# Patient Record
Sex: Female | Born: 2000 | Race: Black or African American | Hispanic: No | Marital: Single | Smoking: Current every day smoker
Health system: Southern US, Community
[De-identification: ages and names within clinical notes are randomized; demographics above are authoritative.]

## PROBLEM LIST (undated history)

## (undated) DIAGNOSIS — Z862 Personal history of diseases of the blood and blood-forming organs and certain disorders involving the immune mechanism: Secondary | ICD-10-CM

## (undated) DIAGNOSIS — L309 Dermatitis, unspecified: Secondary | ICD-10-CM

## (undated) DIAGNOSIS — J302 Other seasonal allergic rhinitis: Secondary | ICD-10-CM

## (undated) DIAGNOSIS — F419 Anxiety disorder, unspecified: Secondary | ICD-10-CM

## (undated) DIAGNOSIS — R55 Syncope and collapse: Secondary | ICD-10-CM

## (undated) HISTORY — DX: Anxiety disorder, unspecified: F41.9

## (undated) HISTORY — DX: Syncope and collapse: R55

## (undated) HISTORY — PX: NO PAST SURGERIES: SHX2092

## (undated) HISTORY — DX: Personal history of diseases of the blood and blood-forming organs and certain disorders involving the immune mechanism: Z86.2

## (undated) HISTORY — DX: Dermatitis, unspecified: L30.9

---
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
## 2019-01-25 ENCOUNTER — Ambulatory Visit: Payer: Medicaid Other | Admitting: Family Medicine

## 2019-01-30 ENCOUNTER — Telehealth: Payer: Self-pay | Admitting: Family Medicine

## 2019-01-30 NOTE — Telephone Encounter (Signed)
Patient notified of her lab results & recommendations. Expressed understanding. Has a follow up appointment scheduled on 02/09/19.

## 2019-01-30 NOTE — Telephone Encounter (Signed)
Called patient to schedule appointment   Patient would like her lab results, patient goes into work at 11:30 and would like a call back before then.

## 2019-02-09 ENCOUNTER — Encounter: Payer: Self-pay | Admitting: Family Medicine

## 2019-02-09 ENCOUNTER — Ambulatory Visit (INDEPENDENT_AMBULATORY_CARE_PROVIDER_SITE_OTHER): Payer: Medicaid Other | Admitting: Family Medicine

## 2019-02-09 VITALS — BP 106/71 | HR 74 | Resp 17 | Ht 65.0 in | Wt 142.4 lb

## 2019-02-09 DIAGNOSIS — K2100 Gastro-esophageal reflux disease with esophagitis, without bleeding: Secondary | ICD-10-CM

## 2019-02-09 DIAGNOSIS — R630 Anorexia: Secondary | ICD-10-CM | POA: Diagnosis not present

## 2019-02-09 DIAGNOSIS — R1110 Vomiting, unspecified: Secondary | ICD-10-CM

## 2019-02-09 DIAGNOSIS — R110 Nausea: Secondary | ICD-10-CM | POA: Diagnosis not present

## 2019-02-09 DIAGNOSIS — R634 Abnormal weight loss: Secondary | ICD-10-CM

## 2019-02-09 MED ORDER — OMEPRAZOLE 20 MG PO CPDR: 20.0000 mg | DELAYED_RELEASE_CAPSULE | Freq: Every day | ORAL | 3 refills | Status: DC

## 2019-02-09 MED ORDER — ONDANSETRON HCL 4 MG PO TABS: 4.0000 mg | ORAL_TABLET | Freq: Three times a day (TID) | ORAL | 2 refills | Status: DC | PRN

## 2019-02-09 NOTE — Progress Notes (Signed)
Patient ID: Terri Potts, female    DOB: 10-13-2001, 18 y.o.   MRN: [id number]  PCP: Terri Neighbors, FNP  Chief Complaint  Patient presents with  . Gastroesophageal Reflux  . Weight Loss    still losing weight. trying to eat more but food either won't stay down or she has no appetite    Subjective:  HPI  Terri Potts is a 18 y.o. female presents for evaluation acid reflux symptoms and unintentional weight loss.  Last office visit note 12/28/2018: Patient presents today with a complaint of unintentional weight loss within the last 5-6 months. No prior known history of thyroid disease or impaired glycemic control. Feels weight loss initially started in June after her high school graduation.  However she feels weight loss and loss of appetite began around September 2019.  She reports most foods cause nausea and some produce vomitus.  She has been seen at the ER with a complaint of epigastric pain on 09/11/2018 underwent a CT of the abdomen was benign of any acute finding.  She also endorses some upper epigastric burning related to food intake.  She is uncertain of which foods exacerbate symptoms.  Present her current weight is 149.  Reports since around the fourth or fifth grade she has been approximately 200 pounds.  She has not made efforts to lose weight.  Denies any known history of diabetes or thyroid disease in the family.  She has not attempted relief with any over-the-counter PPI or antiacid therapy.  She has not seen a gastroenterologist previously or had a prior diagnosis of acid reflux.  Urine pregnancy is negative today.Patient's last menstrual period was 12/06/2018 (exact date). She has a history of marijuana usage although reports she stopped about 2 weeks ago. No prior HIV screen or RPR screening. She is sexually active and reports inconsistent use of barrier protection. She denies new headaches, chest pain, skin rashes,  new weakness ,  SOB, edema, night sweats, urine changes,  diarrhea , constipation, prior abdominal surgeries.  Today's visit  Patient presents today for follow-up of unintentional weight loss and acid reflux symptoms. Patient reports symptoms have not improved since last visit. Endorses frequent nausea with food intake, diminished appetite, and episodic vomiting. Endorses intermittent abdominal pain which she characterizes as burning in mid epigastric region. She is not requiring daily Zofran although she is taking omeprazole daily. She is tolerating fluids without issue. Weight has decreased by 7 lbs since last office visit. Body mass index is 23.7 kg/m.  Patient denies use of illicit drugs, reports no longer use of marijuana, or alcohol use. Social History   Socioeconomic History  . Marital status: Single    Spouse name: Not on file  . Number of children: Not on file  . Years of education: Not on file  . Highest education level: Not on file  Occupational History  . Not on file  Social Needs  . Financial resource strain: Not on file  . Food insecurity:    Worry: Not on file    Inability: Not on file  . Transportation needs:    Medical: Not on file    Non-medical: Not on file  Tobacco Use  . Smoking status: Current Every Day Smoker  . Smokeless tobacco: Never Used  Substance and Sexual Activity  . Alcohol use: No    Alcohol/week: 0.0 standard drinks  . Drug use: Not Currently  . Sexual activity: Never  Lifestyle  . Physical activity:    Days  per week: Not on file    Minutes per session: Not on file  . Stress: Not on file  Relationships  . Social connections:    Talks on phone: Not on file    Gets together: Not on file    Attends religious service: Not on file    Active member of club or organization: Not on file    Attends meetings of clubs or organizations: Not on file    Relationship status: Not on file  . Intimate partner violence:    Fear of current or ex partner: Not on file    Emotionally abused: Not on file     Physically abused: Not on file    Forced sexual activity: Not on file  Other Topics Concern  . Not on file  Social History Narrative  . Not on file    Family History  Problem Relation Age of Onset  . Drug abuse Maternal Grandmother   . Diabetes Maternal Grandmother   . Hypertension Maternal Grandmother   . Hypertension Paternal Grandmother   . Heart murmur Maternal Aunt   . Stroke Neg Hx   . Cancer Neg Hx    Review of Systems Pertinent negatives listed in HPI There are no active problems to display for this patient.   Allergies  Allergen Reactions  . Bee Venom Swelling    At site of bite    Prior to Admission medications   Medication Sig Start Date End Date Taking? Authorizing Provider  omeprazole (PRILOSEC) 20 MG capsule Take 1 capsule (20 mg total) by mouth daily. 12/28/18  Yes Terri NeighborsHarris, Lateya Dauria S, FNP  ondansetron (ZOFRAN) 4 MG tablet Take 1 tablet (4 mg total) by mouth every 8 (eight) hours as needed for nausea or vomiting. 12/28/18  Yes Terri NeighborsHarris, Adlyn Fife S, FNP    Past Medical, Surgical Family and Social History reviewed and updated.    Objective:   Today's Vitals   02/09/19 1556  BP: 106/71  Pulse: 74  Resp: 17  SpO2: 98%  Weight: 142 lb 6.4 oz (64.6 kg)  Height: 5\' 5"  (1.651 m)    Wt Readings from Last 3 Encounters:  02/09/19 142 lb 6.4 oz (64.6 kg) (75 %, Z= 0.68)*  12/28/18 149 lb (67.6 kg) (82 %, Z= 0.91)*  12/16/18 153 lb (69.4 kg) (85 %, Z= 1.03)*   * Growth percentiles are based on CDC (Girls, 2-20 Years) data.    Physical Exam General appearance: alert, well developed, cooperative and in no distress Head: Normocephalic, without obvious abnormality, atraumatic Respiratory: Respirations even and unlabored, normal respiratory rate Heart: rate and rhythm normal. No gallop or murmurs noted on exam  Abdomen: BS +, no distention, no rebound tenderness, or no mass Extremities: No gross deformities Skin: Skin color, texture, turgor normal. No rashes  seen  Psych: Appropriate mood and affect. Neurologic: Mental status: Alert, oriented to person, place, and time, thought content appropriate.   No results found for: POCGLU  Lab Results  Component Value Date   HGBA1C 5.2 12/28/2018      Assessment & Plan:  1. Chronic vomiting - Ambulatory referral to Gastroenterology -continue Zofran as needed. Educated to take at the first sign of nausea oppose to taking after vomiting episodes. Medication should be take to prevent vomitus   2. Loss of appetite GERD vs PUD  - Ambulatory referral to Gastroenterology  3. Chronic nausea -continue Zofran - Ambulatory referral to Gastroenterology  4. Gastroesophageal reflux disease with esophagitis -Continue Omeprazole until you follow-up  with Gastro.  - Ambulatory referral to Gastroenterology    Orders Placed This Encounter  Procedures  . Ambulatory referral to Gastroenterology    Referral Priority:   Routine    Referral Type:   Consultation    Referral Reason:   Specialty Services Required    Number of Visits Requested:   1    -The patient was given clear instructions to go to ER or return to medical center if symptoms do not improve, worsen or new problems develop. The patient verbalized understanding.    Joaquin Courts, FNP Primary Care at [facility] [address] [place] [address] [phone]: [phone]

## 2019-02-09 NOTE — Patient Instructions (Signed)
Peptic Ulcer  A peptic ulcer is a painful sore in the lining of your stomach or the first part of your small intestine. What are the causes? Common causes of this condition include:  An infection.  Using certain pain medicines too often or too much. What increases the risk? You are more likely to get this condition if you:  Smoke.  Have a family history of ulcer disease.  Drink alcohol.  Have been hospitalized in an intensive care unit (ICU). What are the signs or symptoms? Symptoms include:  Burning pain in the area between the chest and the belly button. The pain may: ? Not go away (be persistent). ? Be worse when your stomach is empty. ? Be worse at night.  Heartburn.  Feeling sick to your stomach (nauseous) and throwing up (vomiting).  Bloating. If the ulcer results in bleeding, it can cause you to:  Have poop (stool) that is black and looks like tar.  Throw up bright red blood.  Throw up material that looks like coffee grounds. How is this treated? Treatment for this condition may include:  Stopping things that can cause the ulcer, such as: ? Smoking. ? Using pain medicines.  Medicines to reduce stomach acid.  Antibiotic medicines if the ulcer is caused by an infection.  A procedure that is done using a small, flexible tube that has a camera at the end (upper endoscopy). This may be done if you have a bleeding ulcer.  Surgery. This may be needed if: ? You have a lot of bleeding. ? The ulcer caused a hole somewhere in the digestive system. Follow these instructions at home:  Do not drink alcohol if your doctor tells you not to drink.  Limit how much caffeine you take in.  Do not use any products that contain nicotine or tobacco, such as cigarettes, e-cigarettes, and chewing tobacco. If you need help quitting, ask your doctor.  Take over-the-counter and prescription medicines only as told by your doctor. ? Do not stop or change your medicines unless  you talk with your doctor about it first. ? Do not take aspirin, ibuprofen, or other NSAIDs unless your doctor told you to do so.  Keep all follow-up visits as told by your doctor. This is important. Contact a doctor if:  You do not get better in 7 days after you start treatment.  You keep having an upset stomach (indigestion) or heartburn. Get help right away if:  You have sudden, sharp pain in your belly (abdomen).  You have belly pain that does not go away.  You have bloody poop (stool) or black, tarry poop.  You throw up blood. It may look like coffee grounds.  You feel light-headed or feel like you may pass out (faint).  You get weak.  You get sweaty or feel sticky and cold to the touch (clammy). Summary  Symptoms of a peptic ulcer include burning pain in the area between the chest and the belly button.  Take medicines only as told by your doctor.  Limit how much alcohol and caffeine you have.  Keep all follow-up visits as told by your doctor. This information is not intended to replace advice given to you by your health care provider. Make sure you discuss any questions you have with your health care provider. Document Released: 02/24/2010 Document Revised: 06/07/2018 Document Reviewed: 06/07/2018 Elsevier Interactive Patient Education  2019 [employer]. [place] Diet A bland diet consists of foods that are often soft and do not  have a lot of fat, fiber, or extra seasonings. Foods without fat, fiber, or seasoning are easier for the body to digest. They are also less likely to irritate your mouth, throat, stomach, and other parts of your digestive system. A bland diet is sometimes called a BRAT diet. What is my plan? Your health care provider or food and nutrition specialist (dietitian) may recommend specific changes to your diet to prevent symptoms or to treat your symptoms. These changes may include:  Eating small meals often.  Cooking food until it is soft enough to  chew easily.  Chewing your food well.  Drinking fluids slowly.  Not eating foods that are very spicy, sour, or fatty.  Not eating citrus fruits, such as oranges and grapefruit. What do I need to know about this diet?  Eat a variety of foods from the bland diet food list.  Do not follow a bland diet longer than needed.  Ask your health care provider whether you should take vitamins or supplements. What foods can I eat? Grains  Hot cereals, such as cream of wheat. Rice. Bread, crackers, or tortillas made from refined white flour. Vegetables Canned or cooked vegetables. Mashed or boiled potatoes. Fruits  Bananas. Applesauce. Other types of cooked or canned fruit with the skin and seeds removed, such as canned peaches or pears. Meats and other proteins  Scrambled eggs. Creamy peanut butter or other nut butters. Lean, well-cooked meats, such as chicken or fish. Tofu. Soups or broths. Dairy Low-fat dairy products, such as milk, cottage cheese, or yogurt. Beverages  Water. Herbal tea. Apple juice. Fats and oils Mild salad dressings. Canola or olive oil. Sweets and desserts Pudding. Custard. Fruit gelatin. Ice cream. The items listed above may not be a complete list of recommended foods and beverages. Contact a dietitian for more options. What foods are not recommended? Grains Whole grain breads and cereals. Vegetables Raw vegetables. Fruits Raw fruits, especially citrus, berries, or dried fruits. Dairy Whole fat dairy foods. Beverages Caffeinated drinks. Alcohol. Seasonings and condiments Strongly flavored seasonings or condiments. Hot sauce. Salsa. Other foods Spicy foods. Fried foods. Sour foods, such as pickled or fermented foods. Foods with high sugar content. Foods high in fiber. The items listed above may not be a complete list of foods and beverages to avoid. Contact a dietitian for more information. Summary  A bland diet consists of foods that are often soft  and do not have a lot of fat, fiber, or extra seasonings.  Foods without fat, fiber, or seasoning are easier for the body to digest.  Check with your health care provider to see how long you should follow this diet plan. It is not meant to be followed for long periods. This information is not intended to replace advice given to you by your health care provider. Make sure you discuss any questions you have with your health care provider. Document Released: 03/23/2016 Document Revised: 12/29/2017 Document Reviewed: 12/29/2017 Elsevier Interactive Patient Education  2019 [employer].

## 2019-02-13 DIAGNOSIS — K2100 Gastro-esophageal reflux disease with esophagitis, without bleeding: Secondary | ICD-10-CM | POA: Insufficient documentation

## 2019-02-13 DIAGNOSIS — R110 Nausea: Secondary | ICD-10-CM | POA: Insufficient documentation

## 2019-02-13 DIAGNOSIS — R1110 Vomiting, unspecified: Secondary | ICD-10-CM | POA: Insufficient documentation

## 2019-02-13 DIAGNOSIS — R630 Anorexia: Secondary | ICD-10-CM | POA: Insufficient documentation

## 2019-02-13 DIAGNOSIS — R634 Abnormal weight loss: Secondary | ICD-10-CM | POA: Insufficient documentation

## 2019-02-22 ENCOUNTER — Encounter: Payer: Self-pay | Admitting: Gastroenterology

## 2019-02-28 ENCOUNTER — Encounter: Payer: Self-pay | Admitting: Gastroenterology

## 2019-02-28 ENCOUNTER — Other Ambulatory Visit: Payer: Self-pay

## 2019-02-28 ENCOUNTER — Ambulatory Visit (INDEPENDENT_AMBULATORY_CARE_PROVIDER_SITE_OTHER): Payer: Medicaid Other | Admitting: Gastroenterology

## 2019-02-28 VITALS — BP 90/50 | HR 58 | Temp 97.7°F | Ht 63.5 in | Wt 139.0 lb

## 2019-02-28 DIAGNOSIS — R112 Nausea with vomiting, unspecified: Secondary | ICD-10-CM

## 2019-02-28 DIAGNOSIS — K219 Gastro-esophageal reflux disease without esophagitis: Secondary | ICD-10-CM | POA: Diagnosis not present

## 2019-02-28 DIAGNOSIS — R6881 Early satiety: Secondary | ICD-10-CM

## 2019-02-28 DIAGNOSIS — R634 Abnormal weight loss: Secondary | ICD-10-CM | POA: Diagnosis not present

## 2019-02-28 MED ORDER — ONDANSETRON 4 MG PO TBDP: 4.0000 mg | ORAL_TABLET | Freq: Three times a day (TID) | ORAL | 1 refills | Status: DC | PRN

## 2019-02-28 MED ORDER — OMEPRAZOLE 40 MG PO CPDR: 40.0000 mg | DELAYED_RELEASE_CAPSULE | Freq: Every day | ORAL | 3 refills | Status: DC

## 2019-02-28 NOTE — Progress Notes (Signed)
HPI :  18 y/o female with no significant medical problems referred here by Joaquin Courts FNP for nausea and weight loss.   The patient states about 8-9 months ago she developed severe nausea. Over time it seems to come and go, can be precipitated by eating but also occur spontaneously. She had not had much vomiting until January where she had several days where she had multiple episodes of vomiting, a few times noticed a small amount of blood in her vomit. Since January she has not been vomiting too much. She endorses early satiety and poor appetite. She doesn't eat much. She thinks she is lost over 70 pounds since symptoms started at the end of her senior year last year. She does endorse some pyrosis and reflux symptoms at times. She does endorse some occasional odynophagia but denies any dysphagia. She denies any epigastric pain or postprandial pain. She is not taking any NSAIDs. Her bowels are mostly regular without any blood. She was given trial of low-dose omeprazole in January which she took for month, and then she stopped it. When she takes the Zofran provided to her it does help significantly. She doesn't take it too much.  She has a history of tobacco use. She smokes "black and milds" daily. She previously smoked marijuana routinely during the onset of symptoms through December, although she stop smoking marijuana completely at that time and has not had any improvement in her symptoms.   She's never had a prior upper endoscopy. She denies any family history of malignancy that she is aware of. Her aunt does have Crohn's disease. She's had normal TSH, renal function. CBC shows normal hemoglobin, last checked in October, along with normal liver function testing lipase. She had a CT scan performed in September which did not show any acute pathology or bowel wall thickening however the exam was limited in some areas by lack of enteric contrast.  Pelvic US 10/09/18 - normal exam CT scan 09/11/18 -  no acute pathology noted, somewhat limited exam by lack of enteric contrast   Past Medical History:  Diagnosis Date  . History of anemia   . Seasonal allergies      History reviewed. No pertinent surgical history. Family History  Problem Relation Age of Onset  . Drug abuse Maternal Grandmother   . Diabetes Maternal Grandmother   . Hypertension Maternal Grandmother   . Hypertension Paternal Grandmother   . Heart murmur Maternal Aunt   . Stroke Neg Hx   . Cancer Neg Hx    Social History   Tobacco Use  . Smoking status: Current Every Day Smoker  . Smokeless tobacco: Never Used  Substance Use Topics  . Alcohol use: No    Alcohol/week: 0.0 standard drinks  . Drug use: Not Currently   Current Outpatient Medications  Medication Sig Dispense Refill  . omeprazole (PRILOSEC) 20 MG capsule Take 1 capsule (20 mg total) by mouth daily. 30 capsule 3  . ondansetron (ZOFRAN) 4 MG tablet Take 1 tablet (4 mg total) by mouth every 8 (eight) hours as needed for nausea or vomiting. 30 tablet 2   No current facility-administered medications for this visit.    Allergies  Allergen Reactions  . Bee Venom Swelling    At site of bite     Review of Systems: All systems reviewed and negative except where noted in HPI.   Lab Results  Component Value Date   WBC 11.5 (H) 10/09/2018   HGB 12.1 10/09/2018  HCT 39.1 10/09/2018   MCV 87.5 10/09/2018   PLT 279 10/09/2018    Lab Results  Component Value Date   CREATININE 0.80 12/28/2018   BUN 7 12/28/2018   NA 146 (H) 12/28/2018   K 3.9 12/28/2018   CL 108 (H) 12/28/2018   CO2 24 12/28/2018    Lab Results  Component Value Date   ALT 19 10/09/2018   AST 25 10/09/2018   ALKPHOS 39 10/09/2018   BILITOT 0.9 10/09/2018     Physical Exam: BP (!) 90/50   Pulse (!) 58   Temp 97.7 F (36.5 C)   Ht 5\' 3"  (1.6 m)   Wt 139 lb (63 kg)   BMI 24.62 kg/m  Constitutional: Pleasant,well-developed, female in no acute distress. HEENT:  Normocephalic and atraumatic. Conjunctivae are normal. No scleral icterus. Neck supple.  Cardiovascular: Normal rate, regular rhythm.  Pulmonary/chest: Effort normal and breath sounds normal. No wheezing, rales or rhonchi. Abdominal: Soft, nondistended, nontender.  There are no masses palpable. No hepatomegaly. Extremities: no edema Lymphadenopathy: No cervical adenopathy noted. Neurological: Alert and oriented to person place and time. Skin: Skin is warm and dry. No rashes noted. Psychiatric: Normal mood and affect. Behavior is normal.   ASSESSMENT AND PLAN: 18 year old female here for new patient visit regarding the following:  Nausea / vomiting / early satiety / GERD / weight loss - persistent symptoms over several months. Her vomiting has resolved though she remains quite nauseated with poor appetite. She endorses significant weight loss (70 lbs) since symptoms started last year. She did have significant marijuana use at the onset of symptoms through several months however has completely abstained for marijuana over the past 3 months and states this has not made any significant difference.  CT scan and labs reassuring , however given her persistent symptoms at this point I'm recommending upper endoscopy to further evaluate. I discussed risks and benefits of upper endoscopy and she wanted to proceed. In the interim I'll add back a higher dose of omeprazole 40 mg once daily in light of her reflux symptoms and see if that helps. I also recommend she use Zofran routinely, she can take it a few times a day to keep the nausea at bay and help her appetite. Further recommendations to the results of this exam and her course.  Ileene Patrick, MD [facility]  CC: Bing Neighbors, FNP

## 2019-02-28 NOTE — Patient Instructions (Addendum)
If you are age 19 or older, your body mass index should be between 23-30. Your Body mass index is 24.24 kg/m. If this is out of the aforementioned range listed, please consider follow up with your Primary Care Provider.  If you are age 28 or younger, your body mass index should be between 19-25. Your Body mass index is 24.24 kg/m. If this is out of the aformentioned range listed, please consider follow up with your Primary Care Provider.   To help prevent the possible spread of infection to our patients, communities, and staff; we will be implementing the following measures:  Please only allow one visitor/family member to accompany you to any upcoming appointments with [facility]. If you have any concerns about this please contact our office to discuss prior to the appointment.   You have been scheduled for an endoscopy. Please follow written instructions given to you at your visit today. If you use inhalers (even only as needed), please bring them with you on the day of your procedure. Your physician has requested that you go to www.startemmi.com and enter the access code given to you at your visit today. This web site gives a general overview about your procedure. However, you should still follow specific instructions given to you by our office regarding your preparation for the procedure.  We have sent the following medications to your pharmacy for you to pick up at your convenience: Omeprazole 40mg : once a day Zofran 4mg  ODT: take every morning and every 8 hours as needed  Thank you for entrusting me with your care and for choosing [facility], Dr. Ileene Patrick

## 2019-03-07 ENCOUNTER — Telehealth: Payer: Self-pay

## 2019-03-07 NOTE — Telephone Encounter (Signed)
Covid-19 travel screening questions  Have you traveled in the last 14 days? No. If yes where?  Do you now or have you had a fever in the last 14 days? No.  Do you have any respiratory symptoms of shortness of breath or cough now or in the last 14 days? No.   Do you have a medical history of Congestive Heart Failure?  Do you have a medical history of lung disease?  Do you have any family members or close contacts with diagnosed or suspected Covid-19? No.   Informed patient about our new "No care partner in the lobby" policy. She understood and had no further questions.

## 2019-03-08 ENCOUNTER — Encounter: Payer: Self-pay | Admitting: Gastroenterology

## 2019-03-08 ENCOUNTER — Other Ambulatory Visit: Payer: Self-pay

## 2019-03-08 ENCOUNTER — Ambulatory Visit (AMBULATORY_SURGERY_CENTER): Payer: Medicaid Other | Admitting: Gastroenterology

## 2019-03-08 ENCOUNTER — Encounter: Payer: Medicaid Other | Admitting: Gastroenterology

## 2019-03-08 VITALS — BP 112/74 | HR 55 | Temp 97.7°F | Resp 12 | Ht 63.0 in | Wt 139.0 lb

## 2019-03-08 DIAGNOSIS — K449 Diaphragmatic hernia without obstruction or gangrene: Secondary | ICD-10-CM

## 2019-03-08 DIAGNOSIS — R112 Nausea with vomiting, unspecified: Secondary | ICD-10-CM

## 2019-03-08 DIAGNOSIS — K2950 Unspecified chronic gastritis without bleeding: Secondary | ICD-10-CM

## 2019-03-08 DIAGNOSIS — K2970 Gastritis, unspecified, without bleeding: Secondary | ICD-10-CM

## 2019-03-08 DIAGNOSIS — R634 Abnormal weight loss: Secondary | ICD-10-CM

## 2019-03-08 DIAGNOSIS — R6881 Early satiety: Secondary | ICD-10-CM

## 2019-03-08 MED ORDER — SODIUM CHLORIDE 0.9 % IV SOLN: 500.0000 mL | Freq: Once | INTRAVENOUS | Status: DC

## 2019-03-08 NOTE — Op Note (Signed)
[facility] Patient Name: Terri Potts Procedure Date: 03/08/2019 9:40 AM MRN: [id number] Endoscopist: Viviann Spare P. Adela Lank , MD Age: 18 Referring MD:  Date of Birth: [birth year] Gender: Female Account #: [id number] Procedure:                Upper GI endoscopy Indications:              Early satiety, Nausea with vomiting, Weight loss Medicines:                Monitored Anesthesia Care Procedure:                Pre-Anesthesia Assessment:                           - Prior to the procedure, a History and Physical                            was performed, and patient medications and                            allergies were reviewed. The patient's tolerance of                            previous anesthesia was also reviewed. The risks                            and benefits of the procedure and the sedation                            options and risks were discussed with the patient.                            All questions were answered, and informed consent                            was obtained. Prior Anticoagulants: The patient has                            taken no previous anticoagulant or antiplatelet                            agents. ASA Grade Assessment: II - A patient with                            mild systemic disease. After reviewing the risks                            and benefits, the patient was deemed in                            satisfactory condition to undergo the procedure.                           After obtaining informed consent, the endoscope was  passed under direct vision. Throughout the                            procedure, the patient's blood pressure, pulse, and                            oxygen saturations were monitored continuously. The                            Model GIF-[id number]) scope was introduced                            through the mouth, and advanced to the second part                            of  duodenum. The upper GI endoscopy was                            accomplished without difficulty. The patient                            tolerated the procedure well. Scope In: Scope Out: Findings:                 Esophagogastric landmarks were identified: the                            Z-line was found at 34 cm, the gastroesophageal                            junction was found at 34 cm and the upper extent of                            the gastric folds was found at 35 cm from the                            incisors.                           A 1 cm hiatal hernia was present.                           The exam of the esophagus was otherwise normal.                           The entire examined stomach was normal. Biopsies                            were taken with a cold forceps for Helicobacter                            pylori testing.                           The duodenal bulb and second portion  of the                            duodenum were normal. Complications:            No immediate complications. Estimated blood loss:                            Minimal. Estimated Blood Loss:     Estimated blood loss was minimal. Impression:               - Esophagogastric landmarks identified.                           - 1 cm hiatal hernia.                           - Normal esophagus otherwise.                           - Normal stomach. Biopsied to rule out H pylori.                           - Normal duodenal bulb and second portion of the                            duodenum. Recommendation:           - Patient has a contact number available for                            emergencies. The signs and symptoms of potential                            delayed complications were discussed with the                            patient. Return to normal activities tomorrow.                            Written discharge instructions were provided to the                            patient.                            - Resume previous diet.                           - Continue present medications. (trial of Zofran                            and higher dose PPI)                           - Await pathology results.                           -  Further recommendations pending course and biopsy                            results Willaim Rayas. Darinda [place], MD 03/08/2019 10:17:06 AM This report has been signed electronically.

## 2019-03-08 NOTE — Patient Instructions (Signed)
Continue trial of Zofran and higher dose PPI. Handout given on hiatal hernia. Wait to find out if you test positive for hpylori.    YOU HAD AN ENDOSCOPIC PROCEDURE TODAY AT THE  ENDOSCOPY CENTER:   Refer to the procedure report that was given to you for any specific questions about what was found during the examination.  If the procedure report does not answer your questions, please call your gastroenterologist to clarify.  If you requested that your care partner not be given the details of your procedure findings, then the procedure report has been included in a sealed envelope for you to review at your convenience later.  YOU SHOULD EXPECT: Some feelings of bloating in the abdomen. Passage of more gas than usual.  Walking can help get rid of the air that was put into your GI tract during the procedure and reduce the bloating. If you had a lower endoscopy (such as a colonoscopy or flexible sigmoidoscopy) you may notice spotting of blood in your stool or on the toilet paper. If you underwent a bowel prep for your procedure, you may not have a normal bowel movement for a few days.  Please Note:  You might notice some irritation and congestion in your nose or some drainage.  This is from the oxygen used during your procedure.  There is no need for concern and it should clear up in a day or so.  SYMPTOMS TO REPORT IMMEDIATELY:    Following upper endoscopy (EGD)  Vomiting of blood or coffee ground material  New chest pain or pain under the shoulder blades  Painful or persistently difficult swallowing  New shortness of breath  Fever of 100F or higher  Black, tarry-looking stools  For urgent or emergent issues, a gastroenterologist can be reached at any hour by calling [phone].   DIET:  We do recommend a small meal at first, but then you may proceed to your regular diet.  Drink plenty of fluids but you should avoid alcoholic beverages for 24 hours.  ACTIVITY:  You should plan to  take it easy for the rest of today and you should NOT DRIVE or use heavy machinery until tomorrow (because of the sedation medicines used during the test).    FOLLOW UP: Our staff will call the number listed on your records the next business day following your procedure to check on you and address any questions or concerns that you may have regarding the information given to you following your procedure. If we do not reach you, we will leave a message.  However, if you are feeling well and you are not experiencing any problems, there is no need to return our call.  We will assume that you have returned to your regular daily activities without incident.  If any biopsies were taken you will be contacted by phone or by letter within the next 1-3 weeks.  Please call us at [phone] if you have not heard about the biopsies in 3 weeks.    SIGNATURES/CONFIDENTIALITY: You and/or your care partner have signed paperwork which will be entered into your electronic medical record.  These signatures attest to the fact that that the information above on your After Visit Summary has been reviewed and is understood.  Full responsibility of the confidentiality of this discharge information lies with you and/or your care-partner.

## 2019-03-08 NOTE — Progress Notes (Signed)
Report to PACU, RN, vss, BBS= Clear.  

## 2019-03-08 NOTE — Progress Notes (Signed)
Pt's states no medical or surgical changes since previsit or office visit. 

## 2019-03-08 NOTE — Progress Notes (Signed)
Called to room to assist during endoscopic procedure.  Patient ID and intended procedure confirmed with present staff. Received instructions for my participation in the procedure from the performing physician.  

## 2019-03-09 ENCOUNTER — Telehealth: Payer: Self-pay

## 2019-03-09 NOTE — Telephone Encounter (Signed)
  Follow up Call-  Call back number 03/08/2019  Post procedure Call Back phone  # [phone]  Permission to leave phone message Yes  Some recent data might be hidden     Patient questions:  Do you have a fever, pain , or abdominal swelling? No. Pain Score  0 *  Have you tolerated food without any problems? Yes.    Have you been able to return to your normal activities? Yes.    Do you have any questions about your discharge instructions: Diet   No. Medications  No. Follow up visit  No.  Do you have questions or concerns about your Care? No.  Actions: * If pain score is 4 or above: No action needed, pain <4.

## 2019-04-17 ENCOUNTER — Other Ambulatory Visit: Payer: Self-pay

## 2019-04-17 ENCOUNTER — Ambulatory Visit (INDEPENDENT_AMBULATORY_CARE_PROVIDER_SITE_OTHER): Payer: Medicaid Other | Admitting: Family Medicine

## 2019-04-17 ENCOUNTER — Encounter: Payer: Self-pay | Admitting: Family Medicine

## 2019-04-17 DIAGNOSIS — N760 Acute vaginitis: Secondary | ICD-10-CM | POA: Diagnosis not present

## 2019-04-17 MED ORDER — METRONIDAZOLE 500 MG PO TABS: 500.0000 mg | ORAL_TABLET | Freq: Two times a day (BID) | ORAL | 0 refills | Status: DC

## 2019-04-17 NOTE — Progress Notes (Signed)
Virtual Visit via Telephone Note  I connected with Terri Potts on 04/17/19 at  1:50 PM EDT by telephone and verified that I am speaking with the correct person using two identifiers.  Location: Patient: Conducting this telephonic encounter from her place of employment  Provider: Provider is located at primary care office.   I discussed the limitations, risks, security and privacy concerns of performing an evaluation and management service by telephone and the availability of in person appointments. I also discussed with the patient that there may be a patient responsible charge related to this service. The patient expressed understanding and agreed to proceed.   History of Present Illness: Vaginitis: Patient complains of an abnormal vaginal discharge for 2 weeks. Vaginal symptoms include odor.Vulvar symptoms include none.STI Risk: Very low risk of STD exposureDischarge described as: white.Other associated symptoms: none.Menstrual pattern: She had been bleeding regularly. Contraception: none. Patient's last menstrual period was 04/14/2019.Odor is the same as prior BV infections. Last BV infection 12/16/18. Symptoms resolved with metronidazole.   Assessment and Plan: 1. Acute vaginitis Treating for bacterial vaginosis. Patient has a history of recurrent infections. Discussed considering OTC probiotics, yogurt daily as measures to restore norma flora balance. Also discussed possible suppression therapy. Patient will consider and follow-up if symptoms worsen or do not improve.  Follow Up Instructions: Follow-up as needed    I discussed the assessment and treatment plan with the patient. The patient was provided an opportunity to ask questions and all were answered. The patient agreed with the plan and demonstrated an understanding of the instructions.   The patient was advised to call back or seek an in-person evaluation if the symptoms worsen or if the condition fails to improve as  anticipated.  I provided 15 minutes of non-face-to-face time during this encounter.   Joaquin Courts, FNP

## 2019-04-17 NOTE — Progress Notes (Deleted)
Called patient to initiate their telephone visit with provider Joaquin Courts, FNP-C. Verified date of birth. Patient has c/o "fishy" odor x 2 weeks. Denies pain, itching, abnormal discharge, urinary symptoms. KWalker, CMA.

## 2019-04-18 ENCOUNTER — Encounter: Payer: Self-pay | Admitting: Family Medicine

## 2019-05-10 ENCOUNTER — Ambulatory Visit: Payer: Medicaid Other | Admitting: Family Medicine

## 2019-05-12 IMAGING — US US TRANSVAGINAL NON-OB
1 series · 13 of 25 positions shown · non-contrast
Comparison: CT of the abdomen and pelvis on 09/11/2018

CLINICAL DATA: Pelvic pain and back pain since this morning.

EXAM:
TRANSABDOMINAL AND TRANSVAGINAL ULTRASOUND OF PELVIS
TECHNIQUE: Both transabdominal and transvaginal ultrasound examinations of the
pelvis were performed. Transabdominal technique was performed for
global imaging of the pelvis including uterus, ovaries, adnexal
regions, and pelvic cul-de-sac. It was necessary to proceed with
endovaginal exam following the transabdominal exam to visualize the
uterus, endometrium, ovaries, and adnexal regions.

[Series 1: us transvaginal non-ob · 13 of 108 slices shown]
[im 1/108]
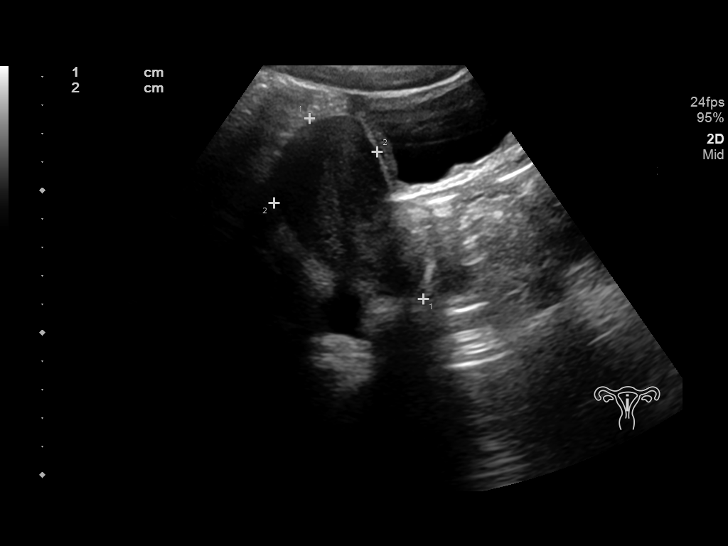
[im 9/108]
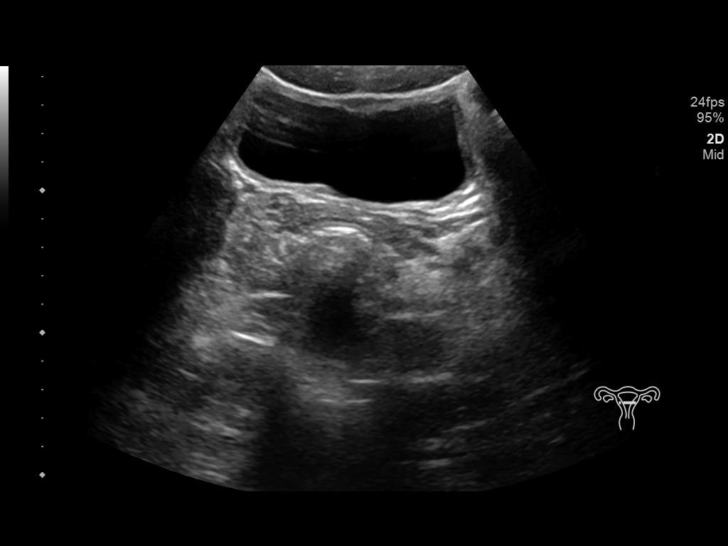
[im 18/108]
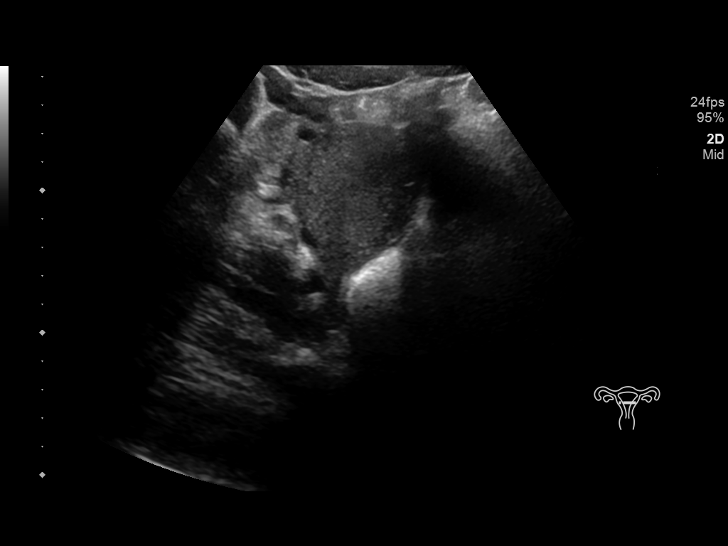
[im 27/108]
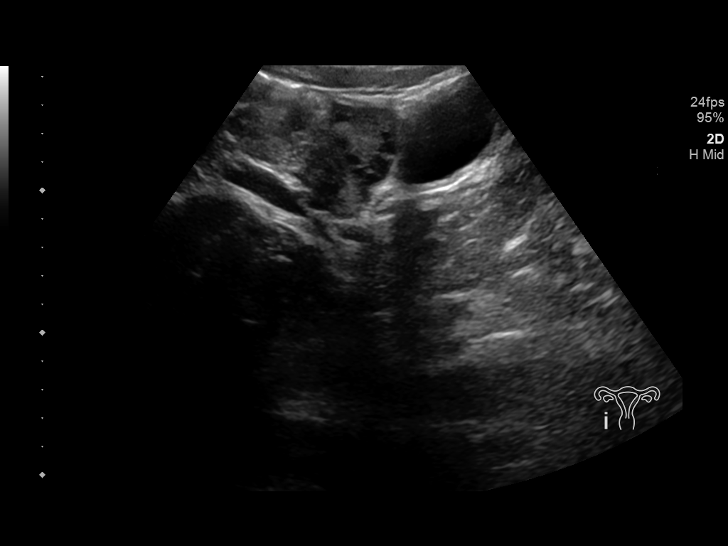
[im 36/108]
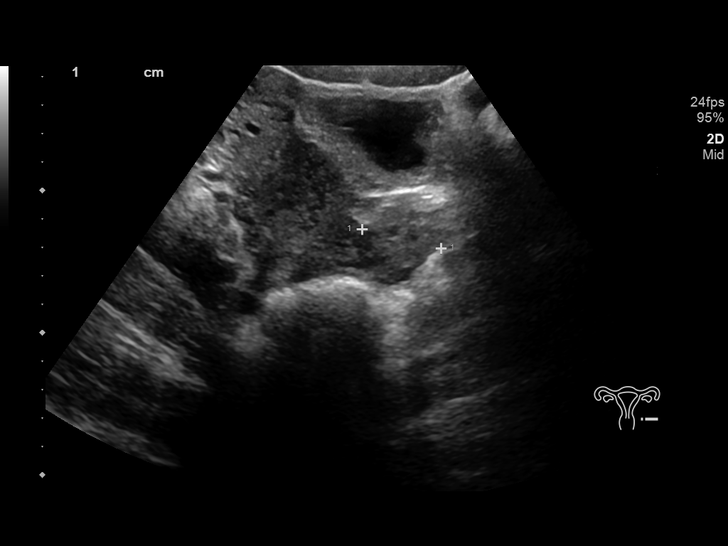
[im 45/108]
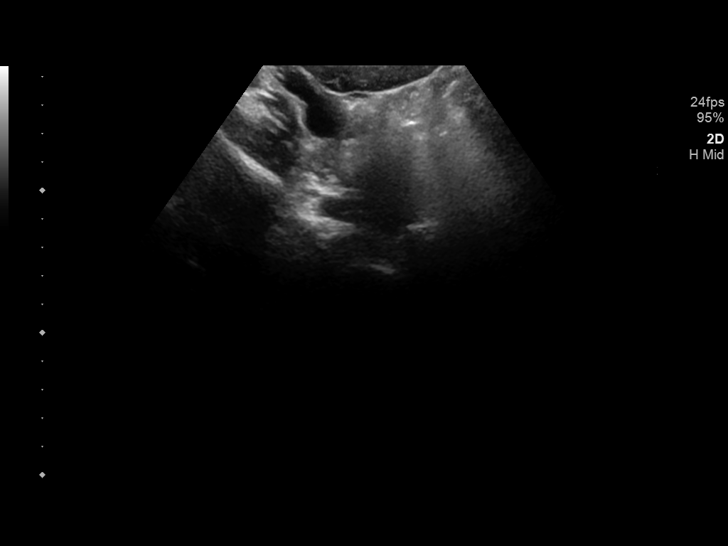
[im 54/108]
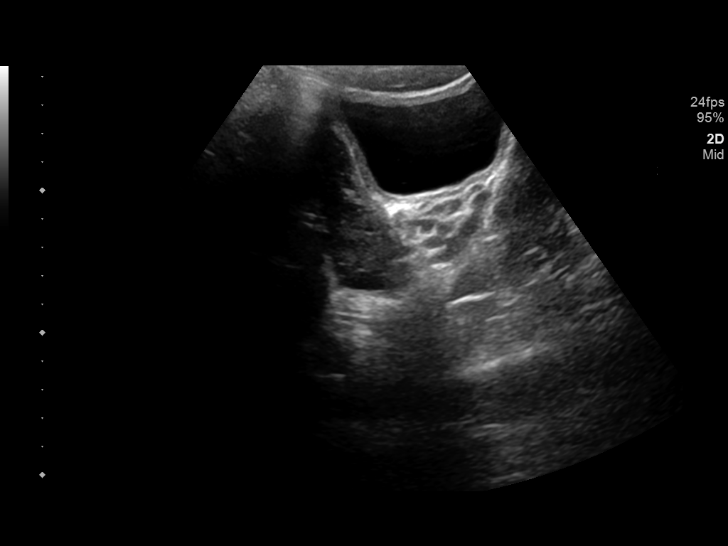
[im 63/108]
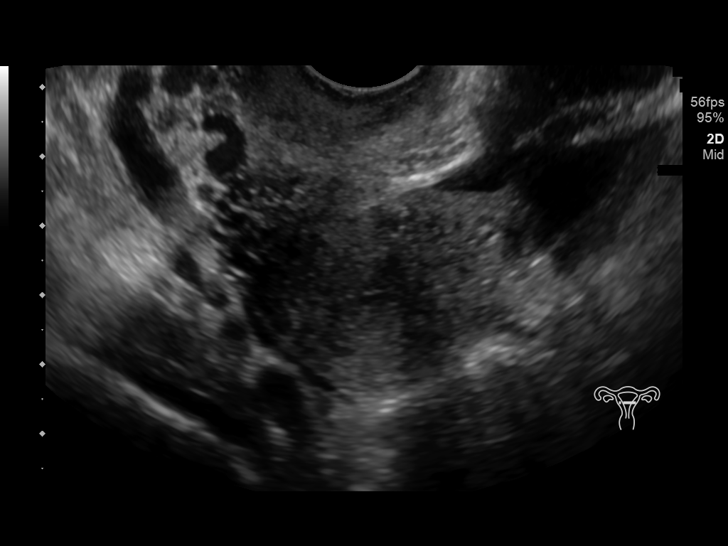
[im 72/108]
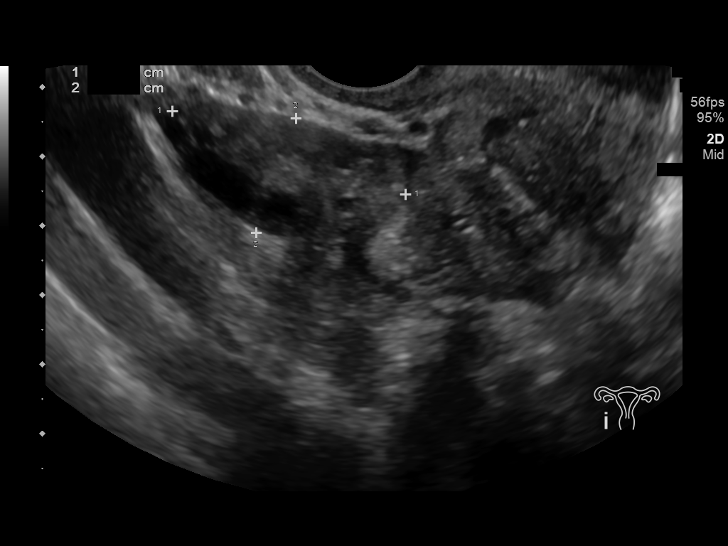
[im 81/108]
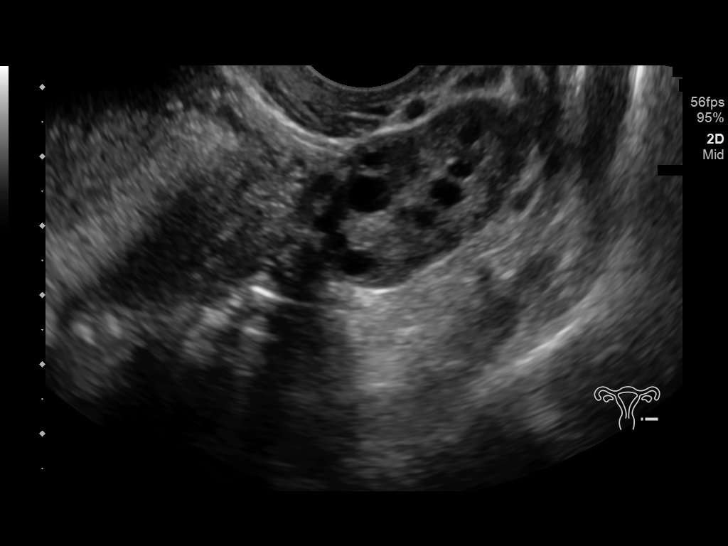
[im 90/108]
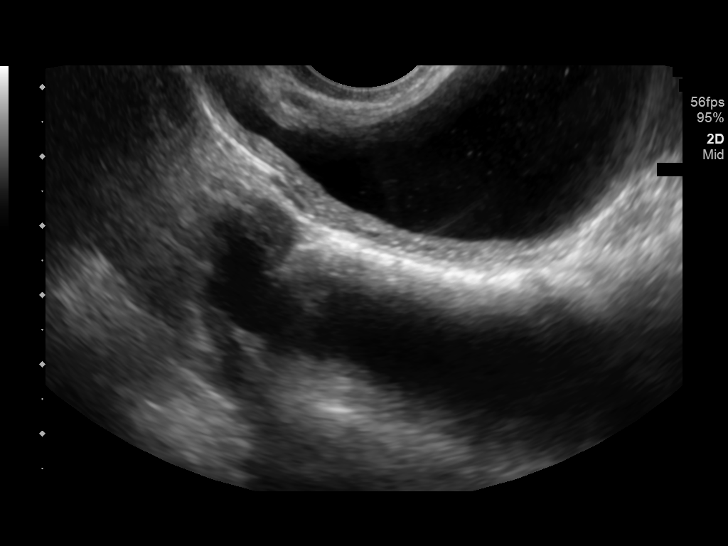
[im 99/108]
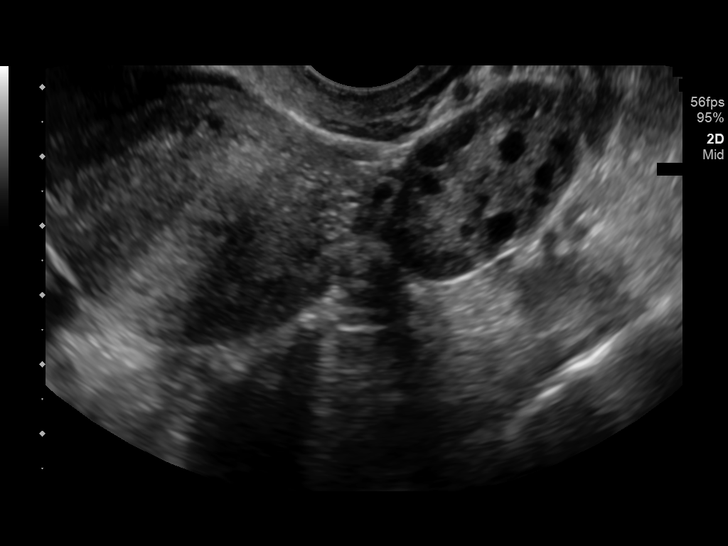
[im 108/108]
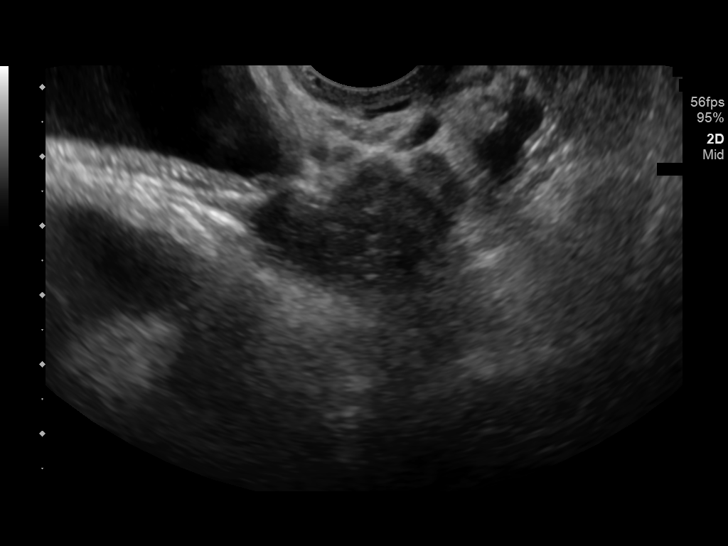

[13 of 25 positions shown; findings below may reference images not displayed]

FINDINGS: Uterus

Measurements: 7.5 x 4.0 x 4.8 centimeters. No fibroids or other mass
visualized.

Endometrium

Thickness: 6.7 millimeters.  No focal abnormality visualized.

Right ovary

Measurements: 3.6 x 1.8 x 2.8 centimeters. Normal appearance/no
adnexal mass. Color Doppler flow is demonstrated.

Left ovary

Measurements: 4.0 x 1.9 x 3.4 centimeters. Normal appearance/no
adnexal mass. Color Doppler flow is identified.

Pulsed Doppler evaluation of both ovaries demonstrates normal
low-resistance arterial and venous waveforms.

Other findings

Trace free pelvic fluid.
IMPRESSION: Normal pelvic ultrasound.

## 2019-06-05 ENCOUNTER — Ambulatory Visit (INDEPENDENT_AMBULATORY_CARE_PROVIDER_SITE_OTHER): Payer: Medicaid Other | Admitting: Family Medicine

## 2019-06-05 ENCOUNTER — Other Ambulatory Visit: Payer: Self-pay

## 2019-06-05 DIAGNOSIS — R110 Nausea: Secondary | ICD-10-CM | POA: Diagnosis not present

## 2019-06-05 DIAGNOSIS — Z3009 Encounter for other general counseling and advice on contraception: Secondary | ICD-10-CM | POA: Diagnosis not present

## 2019-06-05 DIAGNOSIS — R6889 Other general symptoms and signs: Secondary | ICD-10-CM | POA: Diagnosis not present

## 2019-06-05 MED ORDER — ONDANSETRON 4 MG PO TBDP: 4.0000 mg | ORAL_TABLET | Freq: Three times a day (TID) | ORAL | 1 refills | Status: DC | PRN

## 2019-06-05 NOTE — Progress Notes (Signed)
Virtual Visit via Telephone Note  I connected with Terri Potts on 06/05/19 at  1:50 PM EDT by telephone and verified that I am speaking with the correct person using two identifiers.  Location: Patient: Located at home during today's encounter  Provider: Located at primary care office   I discussed the limitations, risks, security and privacy concerns of performing an evaluation and management service by telephone and the availability of in person appointments. I also discussed with the patient that there may be a patient responsible charge related to this service. The patient expressed understanding and agreed to proceed.   History of Present Illness: Weight functionating  Terri Potts reports continued concern regarding weight. She reports that her weight is fluctuating up and down approximately 5lbs.     Contraception  Would like to start birth control. Uncertain of which form. Endorses irregular menstrual cycle as she occasionally has two cycles less than 28 days apart. Reports a 2 year history of anemia related to a heavy cycle and was placed on temporary oral contraception to regulate cycle. She is sexually active.   Assessment and Plan: 1. Chronic nausea Recent negative EGD.  Nausea symptoms have actually improved since GI follow-up.  GERD symptoms have all but resolved as she is currently not taking the omeprazole.  Overall GI work-up was reassuring.  Patient has had no gross abnormal weight loss or early satiety which also is reassuring for any acute underlying GI condition. Will continue Zofran only as needed.  Patient encouraged to avoid irritating foods.  Patient encouraged not to skip meals  She is also encouraged to resume omeprazole if acid reflux symptoms recur.  2. Family planning education, guidance, and counseling - POCT urine pregnancy; Future  -Discussed all available options of contraception.  Patient advised that she would like to initiate contraceptive treatment  with oral contraception.  Patient will return to office tomorrow for lab appointment for an hCG urine test.  Patient advised once a negative pregnancy test has been obtained will prescribe Ortho Tri-Cyclen cycling.  She is advised if symptoms of nausea worsen or headaches occur to follow-up to discuss other oral birth control options.  3.  Fluctuation of weight See #1  Follow Up Instructions: Lab appointment tomorrow 06/06/19 Urine HCG    I discussed the assessment and treatment plan with the patient. The patient was provided an opportunity to ask questions and all were answered. The patient agreed with the plan and demonstrated an understanding of the instructions.   The patient was advised to call back or seek an in-person evaluation if the symptoms worsen or if the condition fails to improve as anticipated.  I provided 20 minutes of non-face-to-face time during this encounter.   Molli Barrows, FNP

## 2019-06-05 NOTE — Progress Notes (Deleted)
Called patient to initiate their telephone visit with provider Molli Barrows, FNP-C. Verified date of birth. Patient states that she has stopped taking the Omeprazole. GERD symptoms have been "ok". Would like refills on Zofran. Patient also states that she is still losing weight. Hasn't been able to weigh herself but she states that her clothes are fitting looser. KWalker, CMA.

## 2019-06-06 ENCOUNTER — Other Ambulatory Visit: Payer: Medicaid Other

## 2019-06-07 ENCOUNTER — Other Ambulatory Visit (INDEPENDENT_AMBULATORY_CARE_PROVIDER_SITE_OTHER): Payer: Medicaid Other

## 2019-06-07 ENCOUNTER — Other Ambulatory Visit: Payer: Self-pay

## 2019-06-07 DIAGNOSIS — Z3009 Encounter for other general counseling and advice on contraception: Secondary | ICD-10-CM

## 2019-06-07 DIAGNOSIS — Z3202 Encounter for pregnancy test, result negative: Secondary | ICD-10-CM

## 2019-06-07 LAB — POCT URINE PREGNANCY: Preg Test, Ur: NEGATIVE

## 2019-06-07 MED ORDER — NORGESTIM-ETH ESTRAD TRIPHASIC 0.18/0.215/0.25 MG-35 MCG PO TABS: 1.0000 | ORAL_TABLET | Freq: Every day | ORAL | 11 refills | Status: DC

## 2019-06-07 NOTE — Progress Notes (Signed)
Patient here for POCT urine pregnancy test for OCP initiation. Test was negative. Informed provider who states that she will go ahead & send over OCP to pharmacy on file. KWalker, CMA.

## 2019-06-07 NOTE — Patient Instructions (Signed)
Negative pregnancy test. Will initiate OCP. Electronically prescribed Ortho-cyclen

## 2019-06-29 ENCOUNTER — Ambulatory Visit: Payer: Medicaid Other | Admitting: Family Medicine

## 2019-07-12 ENCOUNTER — Telehealth: Payer: Self-pay | Admitting: Family Medicine

## 2019-07-12 NOTE — Telephone Encounter (Signed)
Patient called and sated she had been bleeding for 3 weeks and wants to know what to do about the [facility]

## 2019-07-12 NOTE — Telephone Encounter (Signed)
Patient can go to urgent care for evaluation or schedule a visit for next week.

## 2019-07-17 ENCOUNTER — Telehealth: Payer: Medicaid Other | Admitting: Family Medicine

## 2019-08-02 ENCOUNTER — Telehealth: Payer: Self-pay | Admitting: Family Medicine

## 2019-08-02 NOTE — Telephone Encounter (Signed)
Patients mother called and was very rude asked to speak to the supervisor and sated we where playing with her daughters health. We have made appointment for my chart visits and she has canceled. She wanted to switch birthcontrol when Terri Potts was here and kim told here it hadn't been a month and to give it time. Patients mother would like to speak to management

## 2019-08-08 ENCOUNTER — Telehealth: Payer: Self-pay

## 2019-08-08 NOTE — Telephone Encounter (Signed)

## 2019-08-09 ENCOUNTER — Encounter: Payer: Self-pay | Admitting: Nurse Practitioner

## 2019-08-09 ENCOUNTER — Other Ambulatory Visit: Payer: Self-pay

## 2019-08-09 ENCOUNTER — Ambulatory Visit (INDEPENDENT_AMBULATORY_CARE_PROVIDER_SITE_OTHER): Payer: Medicaid Other | Admitting: Nurse Practitioner

## 2019-08-09 VITALS — BP 118/78 | HR 79 | Temp 97.3°F | Resp 16 | Ht 63.5 in | Wt 140.2 lb

## 2019-08-09 DIAGNOSIS — N898 Other specified noninflammatory disorders of vagina: Secondary | ICD-10-CM

## 2019-08-09 DIAGNOSIS — B372 Candidiasis of skin and nail: Secondary | ICD-10-CM | POA: Diagnosis not present

## 2019-08-09 DIAGNOSIS — Z3202 Encounter for pregnancy test, result negative: Secondary | ICD-10-CM | POA: Diagnosis not present

## 2019-08-09 DIAGNOSIS — R634 Abnormal weight loss: Secondary | ICD-10-CM

## 2019-08-09 DIAGNOSIS — Z3009 Encounter for other general counseling and advice on contraception: Secondary | ICD-10-CM

## 2019-08-09 LAB — POCT URINE PREGNANCY: Preg Test, Ur: NEGATIVE

## 2019-08-09 MED ORDER — NYSTATIN 100000 UNIT/GM EX CREA: 1.0000 "application " | TOPICAL_CREAM | Freq: Two times a day (BID) | CUTANEOUS | 0 refills | Status: DC

## 2019-08-09 MED ORDER — FLUCONAZOLE 150 MG PO TABS: 150.0000 mg | ORAL_TABLET | ORAL | 0 refills | Status: AC

## 2019-08-09 NOTE — Progress Notes (Signed)
Patient has had prolonged bleeding while on OCP. Would like to discuss switching to Depo-Provera.  States that she has noticed worsened cramps & clotting on her cycles now.

## 2019-08-09 NOTE — Progress Notes (Signed)
Assessment & Plan:  Terri Potts was seen today for contraception.  Diagnoses and all orders for this visit:  Encounter for other general counseling or advice on contraception -     POCT urine pregnancy  Unintended weight loss -     Referral to Nutrition and Diabetes Services  Candidiasis, intertrigo -     fluconazole (DIFLUCAN) 150 MG tablet; Take 1 tablet (150 mg total) by mouth every 3 (three) days for 3 doses. -     nystatin cream (MYCOSTATIN); Apply 1 application topically 2 (two) times daily.    Patient has been counseled on age-appropriate routine health concerns for screening and prevention. These are reviewed and up-to-date. Referrals have been placed accordingly. Immunizations are up-to-date or declined.    Subjective:   Chief Complaint  Patient presents with  . Contraception   HPI Terri Potts 18 y.o. female presents to office today initially [facility]. However after inquiring into the reason she is desiring to switch birth control she states "so I can  gain weight". I asked her how she was doing on her current birth control and she states she had a normal cycle this month compared to last month in which she experienced significant menorrhagia and metrorrhagia. I explained to her that everyone does not have the same side effects with their birth control and there is no guarantee that she will gain weight with depo and she may also begin to have irregular cycles with initiation of a new contraceptive. She now declines depo and will continue on OCP. She is a smoker and has been made of the risk of clot formation with OCP and smoking.   Rash: Patient complains of rash involving the genitalia and and inbetween her buttocks near rectum. Rash started a few weeks ago. Discomfort associated with rash: is pruritic. Patient has not had previous evaluation of rash. Patient has not had previous treatment.   Patient has not had contacts with  similar rash. Patient has not identified precipitant. Patient has not had new exposures (soaps, lotions, laundry detergents, foods, medications, plants, insects or animals.) She had a prolonged menstrual cycle last month and states she does not use tampons and wears pads only. Likely the cause of her rash.   Dep[facility] screen [facility] 12/28/2018 12/28/2018  Decreased Interest 2 2  Down, Depressed, Hopeless 2 2  PHQ - 2 Score 4 4  Altered sleeping 1 1  Tired, decreased energy 2 2  Change in appetite 2 2  Feeling bad or failure about yourself  1 1  Trouble concentrating 0 0  Moving slowly or fidgety/restless 0 0  Suicidal thoughts 0 0  PHQ-9 Score 10 10   Review of Systems  Constitutional: Negative for fever, malaise/fatigue and weight loss.  HENT: Negative.  Negative for nosebleeds.   Eyes: Negative.  Negative for blurred vision, double vision and photophobia.  Respiratory: Negative.  Negative for cough and shortness of breath.   Cardiovascular: Negative.  Negative for chest pain, palpitations and leg swelling.  Gastrointestinal: Negative.  Negative for heartburn, nausea and vomiting.  Musculoskeletal: Negative.  Negative for myalgias.  Skin: Positive for itching and rash.  Neurological: Negative.  Negative for dizziness, focal weakness, seizures and headaches.  Endo/Heme/Allergies: Positive for environmental allergies.  Psychiatric/Behavioral: Negative.  Negative for suicidal ideas.    Past Medical History:  Diagnosis Date  . History of anemia   . Seasonal allergies     No past  surgical history on file.  Family History  Problem Relation Age of Onset  . Drug abuse Maternal Grandmother   . Diabetes Maternal Grandmother   . Hypertension Maternal Grandmother   . Hypertension Paternal Grandmother   . Heart murmur Maternal Aunt   . Stroke Neg Hx   . Cancer Neg Hx     Social History Reviewed with no changes to be made today.   Outpatient Medications Prior to Visit  Medication  Sig Dispense Refill  . Norgestimate-Ethinyl Estradiol Triphasic (ORTHO TRI-CYCLEN, 28,) 0.18/0.215/0.25 MG-35 MCG tablet Take 1 tablet by mouth daily. 1 Package 11  . omeprazole (PRILOSEC) 40 MG capsule Take 1 capsule by mouth daily.    . ondansetron (ZOFRAN ODT) 4 MG disintegrating tablet Take 1 tablet (4 mg total) by mouth every 8 (eight) hours as needed for nausea or vomiting. 90 tablet 1   No facility-administered medications prior to visit.     Allergies  Allergen Reactions  . Bee Venom Swelling    At site of bite       Objective:    BP 118/78   Pulse 79   Temp (!) 97.3 F (36.3 C) (Temporal)   Resp 16   Ht 5' 3.5" (1.613 m)   Wt 140 lb 3.2 oz (63.6 kg)   LMP 08/06/2019   SpO2 99%   BMI 24.45 kg/m  Wt Readings from Last 3 Encounters:  08/09/19 140 lb 3.2 oz (63.6 kg) (71 %, Z= 0.55)*  03/08/19 139 lb (63 kg) (71 %, Z= 0.55)*  02/28/19 139 lb (63 kg) (71 %, Z= 0.55)*   * Growth percentiles are based on CDC (Girls, 2-20 Years) data.    Physical Exam Vitals signs and nursing note reviewed.  Constitutional:      Appearance: She is well-developed.  HENT:     Head: Normocephalic and atraumatic.  Neck:     Musculoskeletal: Normal range of motion.  Cardiovascular:     Rate and Rhythm: Normal rate and regular rhythm.     Heart sounds: Normal heart sounds. No murmur. No friction rub. No gallop.   Pulmonary:     Effort: Pulmonary effort is normal. No tachypnea or respiratory distress.     Breath sounds: Normal breath sounds. No decreased breath sounds, wheezing, rhonchi or rales.  Chest:     Chest wall: No tenderness.  Abdominal:     General: Bowel sounds are normal.     Palpations: Abdomen is soft.  Genitourinary:    Exam position: Knee-chest position.     Comments: Candidal rash leading from introitus to rectal area and gluteal fold Musculoskeletal: Normal range of motion.  Skin:    General: Skin is warm and dry.  Neurological:     Mental Status: She is alert  and oriented to person, place, and time.     Coordination: Coordination normal.  Psychiatric:        Behavior: Behavior normal. Behavior is cooperative.        Thought Content: Thought content normal.        Judgment: Judgment normal.          Patient has been counseled extensively about nutrition and exercise as well as the importance of adherence with medications and regular follow-up. The patient was given clear instructions to go to ER or return to medical center if symptoms don't improve, worsen or new problems develop. The patient verbalized understanding.   Follow-up: Return if symptoms worsen or fail to improve.   Vernia Buff  Meredeth Ide, FNP-BC [facility] [place], [place] [phone]

## 2019-08-10 ENCOUNTER — Encounter: Payer: Self-pay | Admitting: Nurse Practitioner

## 2019-09-01 ENCOUNTER — Encounter (HOSPITAL_COMMUNITY): Payer: Self-pay | Admitting: Emergency Medicine

## 2019-09-01 ENCOUNTER — Ambulatory Visit (HOSPITAL_COMMUNITY)
Admission: EM | Admit: 2019-09-01 | Discharge: 2019-09-01 | Disposition: A | Discharge status: Home / Self Care | Payer: Medicaid Other | Attending: Family Medicine | Admitting: Family Medicine

## 2019-09-01 ENCOUNTER — Other Ambulatory Visit: Payer: Self-pay

## 2019-09-01 DIAGNOSIS — Z1159 Encounter for screening for other viral diseases: Secondary | ICD-10-CM | POA: Diagnosis not present

## 2019-09-01 DIAGNOSIS — Z79899 Other long term (current) drug therapy: Secondary | ICD-10-CM | POA: Diagnosis not present

## 2019-09-01 DIAGNOSIS — R51 Headache: Secondary | ICD-10-CM | POA: Insufficient documentation

## 2019-09-01 DIAGNOSIS — J3489 Other specified disorders of nose and nasal sinuses: Secondary | ICD-10-CM | POA: Insufficient documentation

## 2019-09-01 DIAGNOSIS — J069 Acute upper respiratory infection, unspecified: Secondary | ICD-10-CM | POA: Insufficient documentation

## 2019-09-01 DIAGNOSIS — R112 Nausea with vomiting, unspecified: Secondary | ICD-10-CM | POA: Insufficient documentation

## 2019-09-01 DIAGNOSIS — B9789 Other viral agents as the cause of diseases classified elsewhere: Secondary | ICD-10-CM | POA: Insufficient documentation

## 2019-09-01 DIAGNOSIS — R0981 Nasal congestion: Secondary | ICD-10-CM | POA: Insufficient documentation

## 2019-09-01 DIAGNOSIS — Z20828 Contact with and (suspected) exposure to other viral communicable diseases: Secondary | ICD-10-CM | POA: Diagnosis not present

## 2019-09-01 DIAGNOSIS — K210 Gastro-esophageal reflux disease with esophagitis: Secondary | ICD-10-CM | POA: Insufficient documentation

## 2019-09-01 DIAGNOSIS — F17210 Nicotine dependence, cigarettes, uncomplicated: Secondary | ICD-10-CM | POA: Insufficient documentation

## 2019-09-01 MED ORDER — BENZONATATE 100 MG PO CAPS: 100.0000 mg | ORAL_CAPSULE | Freq: Three times a day (TID) | ORAL | 0 refills | Status: DC

## 2019-09-01 MED ORDER — CETIRIZINE HCL 10 MG PO TABS: 10.0000 mg | ORAL_TABLET | Freq: Every day | ORAL | 0 refills | Status: DC

## 2019-09-01 MED ORDER — FLUTICASONE PROPIONATE 50 MCG/ACT NA SUSP: 1.0000 | Freq: Every day | NASAL | 2 refills | Status: DC

## 2019-09-01 NOTE — Discharge Instructions (Addendum)
COVID testing done Use the medication as prescribed for your symptoms.  Work note given Follow up as needed for continued or worsening symptoms

## 2019-09-01 NOTE — ED Triage Notes (Signed)
Pt reports waking up Tuesday morning with nasal congestion and a cough.  Pt states it is a productive cough.  She also reports some mild abdominal pain.  She denies fever, SOB, loss of taste or smell, body aches, sore throat, or ear pain.  Pt has been taking Nyquil for her symptoms along with cough drops.

## 2019-09-02 NOTE — ED Provider Notes (Signed)
[facility]    CSN: [id number] Arrival date & time: 09/01/19  1046      History   Chief Complaint Chief Complaint  Patient presents with  . Nasal Congestion  . Cough  . Headache  . abominal pain    HPI Terri Potts is a 18 y.o. female.   Patient is a 18 year old female with past medical history of anemia, seasonal allergies.  She presents today with nasal congestion, rhinorrhea, productive cough with mild abdominal discomfort and nausea.  This is been constant, waxing and waning for approximate 4 days.  She is been using NyQuil for her symptoms.  She is also been using cough drops.  She denies any associated fever, shortness of breath, loss of taste or smell, body aches, sore throat or ear pain.  No recent sick contacts or recent traveling.  No concern for COVID.  ROS per HPI      Past Medical History:  Diagnosis Date  . History of anemia   . Seasonal allergies     Patient Active Problem List   Diagnosis Date Noted  . Chronic vomiting 02/13/2019  . Chronic nausea 02/13/2019  . Loss of appetite 02/13/2019  . Gastroesophageal reflux disease with esophagitis 02/13/2019  . Unintended weight loss 02/13/2019    History reviewed. No pertinent surgical history.  OB History    Gravida  0   Para  0   Term  0   Preterm  0   AB  0   Living  0     SAB  0   TAB  0   Ectopic  0   Multiple  0   Live Births               Home Medications    Prior to Admission medications   Medication Sig Start Date End Date Taking? Authorizing Provider  Norgestimate-Ethinyl Estradiol Triphasic (ORTHO TRI-CYCLEN, 28,) 0.18/0.215/0.25 MG-35 MCG tablet Take 1 tablet by mouth daily. 06/07/19  Yes Bing Neighbors, FNP  benzonatate (TESSALON) 100 MG capsule Take 1 capsule (100 mg total) by mouth every 8 (eight) hours. 09/01/19   Dahlia Byes A, NP  cetirizine (ZYRTEC) 10 MG tablet Take 1 tablet (10 mg total) by mouth daily. 09/01/19   Isidoro Santillana, Gloris Manchester A, NP   fluticasone (FLONASE) 50 MCG/ACT nasal spray Place 1 spray into both nostrils daily. 09/01/19   Dahlia Byes A, NP  nystatin cream (MYCOSTATIN) Apply 1 application topically 2 (two) times daily. 08/09/19   Claiborne Rigg, NP  omeprazole (PRILOSEC) 40 MG capsule Take 1 capsule by mouth daily. 07/03/19   [provider]  ondansetron (ZOFRAN ODT) 4 MG disintegrating tablet Take 1 tablet (4 mg total) by mouth every 8 (eight) hours as needed for nausea or vomiting. 06/05/19   Bing Neighbors, FNP    Family History Family History  Problem Relation Age of Onset  . Drug abuse Maternal Grandmother   . Diabetes Maternal Grandmother   . Hypertension Maternal Grandmother   . Hypertension Paternal Grandmother   . Heart murmur Maternal Aunt   . Stroke Neg Hx   . Cancer Neg Hx     Social History Social History   Tobacco Use  . Smoking status: Current Every Day Smoker  . Smokeless tobacco: Never Used  Substance Use Topics  . Alcohol use: No    Alcohol/week: 0.0 standard drinks  . Drug use: Not Currently     Allergies   Bee venom  Review of Systems Review of Systems   Physical Exam Triage Vital Signs ED Triage Vitals  Enc Vitals Group     BP --      Pulse Rate 09/01/19 1052 63     Resp 09/01/19 1052 10     Temp 09/01/19 1052 98.3 F (36.8 C)     Temp src --      SpO2 09/01/19 1052 100 %     Weight --      Height --      Head Circumference --      Peak Flow --      Pain Score 09/01/19 1053 0     Pain Loc --      Pain Edu? --      Excl. in GC? --    No data found.  Updated Vital Signs Pulse 63   Temp 98.3 F (36.8 C)   Resp 10   LMP 08/06/2019 (Exact Date)   SpO2 100%   Visual Acuity Right Eye Distance:   Left Eye Distance:   Bilateral Distance:    Right Eye Near:   Left Eye Near:    Bilateral Near:     Physical Exam Vitals signs and nursing note reviewed.  Constitutional:      General: She is not in acute distress.    Appearance: Normal  appearance. She is well-developed. She is not ill-appearing, toxic-appearing or diaphoretic.  HENT:     Head: Normocephalic and atraumatic.     Right Ear: Tympanic membrane and ear canal normal.     Left Ear: Tympanic membrane and ear canal normal.     Nose: Congestion and rhinorrhea present.     Mouth/Throat:     Pharynx: Oropharynx is clear.  Eyes:     Conjunctiva/sclera: Conjunctivae normal.  Neck:     Musculoskeletal: Normal range of motion and neck supple.  Cardiovascular:     Rate and Rhythm: Normal rate and regular rhythm.     Heart sounds: No murmur.  Pulmonary:     Effort: Pulmonary effort is normal. No respiratory distress.     Breath sounds: Normal breath sounds.  Abdominal:     Palpations: Abdomen is soft.     Tenderness: There is no abdominal tenderness.  Skin:    General: Skin is warm and dry.  Neurological:     Mental Status: She is alert.      UC Treatments / Results  Labs (all labs ordered are listed, but only abnormal results are displayed) Labs Reviewed  NOVEL CORONAVIRUS, NAA (HOSP ORDER, SEND-OUT TO REF LAB; TAT 18-24 HRS)    EKG   Radiology No results found.  Procedures Procedures (including critical care time)  Medications Ordered in UC Medications - No data to display  Initial Impression / Assessment and Plan / UC Course  I have reviewed the triage vital signs and the nursing notes.  Pertinent labs & imaging results that were available during my care of the patient were reviewed by me and considered in my medical decision making (see chart for details).     Viral URI versus allergies.  Treating with Tessalon Perles for cough.  Flonase and Zyrtec daily. COVID testing sent based on symptoms. Final Clinical Impressions(s) / UC Diagnoses   Final diagnoses:  Viral URI with cough     Discharge Instructions     COVID testing done Use the medication as prescribed for your symptoms.  Work note given Follow up as needed for continued  or  worsening symptoms     ED Prescriptions    Medication Sig Dispense Auth. Provider   fluticasone (FLONASE) 50 MCG/ACT nasal spray Place 1 spray into both nostrils daily. 16 g Kemara Quigley A, NP   cetirizine (ZYRTEC) 10 MG tablet Take 1 tablet (10 mg total) by mouth daily. 30 tablet Arby Dahir A, NP   benzonatate (TESSALON) 100 MG capsule Take 1 capsule (100 mg total) by mouth every 8 (eight) hours. 21 capsule Zelphia Glover A, NP     PDMP not reviewed this encounter.   Loura Halt A, NP 09/02/19 1014

## 2019-09-04 ENCOUNTER — Encounter (HOSPITAL_COMMUNITY): Payer: Self-pay

## 2019-09-04 LAB — NOVEL CORONAVIRUS, NAA (HOSP ORDER, SEND-OUT TO REF LAB; TAT 18-24 HRS): SARS-CoV-2, NAA: NOT DETECTED

## 2019-09-20 ENCOUNTER — Ambulatory Visit: Payer: Medicaid Other | Admitting: Registered"

## 2019-09-26 ENCOUNTER — Other Ambulatory Visit: Payer: Self-pay

## 2019-09-26 ENCOUNTER — Encounter: Payer: Self-pay | Admitting: Registered"

## 2019-09-26 ENCOUNTER — Encounter: Payer: Medicaid Other | Attending: Nurse Practitioner | Admitting: Registered"

## 2019-09-26 DIAGNOSIS — R634 Abnormal weight loss: Secondary | ICD-10-CM | POA: Diagnosis present

## 2019-09-26 NOTE — Progress Notes (Signed)
Appointment start time: 9:15  Appointment end time: 10:16  Patient was seen on 09/26/2019 for nutrition counseling pertaining to disordered eating  Primary care provider: Bertram Denver, NP Therapist:   ROI:  Any other medical team members:  Parents: none   Assessment  Pt arrives stating she can't eat. States the things she used to eat she doesn't eat anymore. Everything she eats and drinks irritates her stomach-including toothpaste. Reports she noticed changes June/July 2019 after graduating from high school.   Lives with mom and 4 siblings (ages 6, 46, 18, 59). She is the oldest child.  States she used to be 215 lbs and now weighs about 137 lbs. States she weighs herself weekly to see if she's gaining weight but states it continues to decrease.   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 States she has stopped eating seafood because she was talking to someone who is allergic to seafood and decided not to eat it anymore.   States she has been stressing and thinks she has lost weight due to depression. States its like everyday. Reports having trouble sleeping and poor appetite. States she does not have friends. Reports she only worries about self and nobody else.   States sometimes she vomits out of nowhere. States she had abdominal pain last fall, was in and out of the hospital, no diagnosis. States she vomited twice yesterday 2 pm and 12 am after drinking milk with cereal. Reports her family thinks she's sick and they talk about about it all the time.   Discussed with pt the importance of her health and being seen by eating disorder specialist. Pt states she is currently not open to seeing [facility]. Will follow back up with patient for medical supervision.     Growth Metrics: not provided  Eating history: Length of time: one year Previous treatments: none Goals for RD meetings: improve   Weight history:  Highest  weight: 220   Lowest weight: 137 Most consistent weight: N/A  What would you like to weigh:160 How has weight changed in the past year: weigh loss of ~78 lbs  Medical Information:  Changes in hair, skin, nails since ED started: none Chewing/swallowing difficulties: difficulties swallowing pills Reflux or heartburn: no Trouble with teeth: teeth hurt when eating Effect of exercise on menses: walks to and from work Effect of hormones on menses: 09/23, monthly Constipation, diarrhea: no, daily Dizziness/lightheadedness: sometimes Headaches/body aches: lower abdominal pain sometimes Heart racing/chest pain: chest pain alot Mood: moody, irritable alot  Sleep: up and down, sleeps about 12 hours Focus/concentration: easily distracted Cold intolerance: yes Vision changes: no  Mental health diagnosis: DE   Dietary assessment: A typical day consists of 1 meals and 0-1 snacks  Safe foods include: noodles  Avoided foods include: seafood, chicken, pizza, tacos, burgers, hot dogs, macaroni and cheese  24 hour recall:  B: skips S: skips L: skips S: skips D (8 pm): 1/2 c spaghetti (noodles, sauce, meat)  S (12 am): fruit pebbles + whole milk  Beverages: whole milk, sometimes drinks tea and water  Physical activity: walks 40 min (total) to and from work, 6-7 days/week  What Methods Do You Use To Control Your Weight (Compensatory behaviors)?           Restricting (calories, fat, carbs)  SIV  Diet pills  Laxatives  Diuretics  Alcohol or drugs  Exercise (what type)  Food rules or rituals (explain)  Binge  Estimated  energy intake: ~400 kcal  Estimated energy needs: 2000-2200 kcal 250-275 g CHO 100-110 g pro 67-73 g fat  Nutrition Diagnosis: NB-1.5 Disordered eating pattern As related to skipping meals.  As evidenced by dietary recall.  Intervention/Goals: Pt was educated and counseled on the correlation between being inadequately nourished and present signs/symptoms.  Discussed importance of healthcare team including medical provider, therapist, and dietitian to help with restoring her body. Dicussed wanting to refer pt to [facility] to be seen by specialist team but pt declines offer, stating she does not like to be around a lot of people. Will discuss this again. Discussed continuing to eat pasta to help nourish body.   Meal plan:    2 meals    1-2 snacks  Monitoring and Evaluation: Patient will follow up in 3 weeks. Pt needs to follow-up in 1 week but unable due to schedule availability.

## 2019-09-29 ENCOUNTER — Encounter (HOSPITAL_COMMUNITY): Payer: Self-pay

## 2019-09-29 ENCOUNTER — Ambulatory Visit (HOSPITAL_COMMUNITY)
Admission: EM | Admit: 2019-09-29 | Discharge: 2019-09-29 | Disposition: A | Discharge status: Home / Self Care | Payer: Medicaid Other | Attending: Family Medicine | Admitting: Family Medicine

## 2019-09-29 ENCOUNTER — Other Ambulatory Visit: Payer: Self-pay

## 2019-09-29 DIAGNOSIS — N3090 Cystitis, unspecified without hematuria: Secondary | ICD-10-CM | POA: Insufficient documentation

## 2019-09-29 LAB — POCT URINALYSIS DIP (DEVICE)
Glucose, UA: NEGATIVE mg/dL
Ketones, ur: NEGATIVE mg/dL
Leukocytes,Ua: NEGATIVE
Nitrite: NEGATIVE
Protein, ur: 100 mg/dL — AB
Specific Gravity, Urine: >=1.03 (ref 1.005–1.030)
Urobilinogen, UA: 0.2 mg/dL (ref 0.0–1.0)
pH: 6 (ref 5.0–8.0)

## 2019-09-29 MED ORDER — PHENAZOPYRIDINE HCL 200 MG PO TABS: 200.0000 mg | ORAL_TABLET | Freq: Three times a day (TID) | ORAL | 0 refills | Status: DC

## 2019-09-29 MED ORDER — NITROFURANTOIN MONOHYD MACRO 100 MG PO CAPS: 100.0000 mg | ORAL_CAPSULE | Freq: Two times a day (BID) | ORAL | 0 refills | Status: AC

## 2019-09-29 NOTE — ED Triage Notes (Signed)
Pt presents to UC w/ c/o urinary urgency and blood in her urine, some burning on urination x3 days.

## 2019-09-29 NOTE — Discharge Instructions (Signed)
Your urine sample actually looks pretty good today.  However, your symptoms are very consistent with UTI, therefore we will start treatment, pending the results of a urine culture. If this is also negative you may stop taking the antibiotics.  We will also swab and test the vagina to ensure there is not source of infection there to cause your symptoms.  Drink plenty of water to empty bladder regularly. Avoid alcohol and caffeine as these may irritate the bladder.

## 2019-09-29 NOTE — ED Provider Notes (Signed)
[facility]    CSN: [id number] Arrival date & time: 09/29/19  1020      History   Chief Complaint Chief Complaint  Patient presents with  . urinary urgency    HPI GIA LUSHER is a 18 y.o. female.   [name] presents with complaints of frequency, urgency and burning with urination which started 3 days ago. Some abdominal pain. No back pain. Has noted some blood with wiping. No fevers. She has chronic vomiting/nausea, no change for her. No specific vaginal symptoms but states she may have noted an odor. LMP 9/23. States has had UTI in the past and this feels similar.     ROS per HPI, negative if not otherwise mentioned.      Past Medical History:  Diagnosis Date  . History of anemia   . Seasonal allergies     Patient Active Problem List   Diagnosis Date Noted  . Chronic vomiting 02/13/2019  . Chronic nausea 02/13/2019  . Loss of appetite 02/13/2019  . Gastroesophageal reflux disease with esophagitis 02/13/2019  . Unintended weight loss 02/13/2019    History reviewed. No pertinent surgical history.  OB History    Gravida  0   Para  0   Term  0   Preterm  0   AB  0   Living  0     SAB  0   TAB  0   Ectopic  0   Multiple  0   Live Births               Home Medications    Prior to Admission medications   Medication Sig Start Date End Date Taking? Authorizing Provider  benzonatate (TESSALON) 100 MG capsule Take 1 capsule (100 mg total) by mouth every 8 (eight) hours. 09/01/19   Dahlia Byes A, NP  cetirizine (ZYRTEC) 10 MG tablet Take 1 tablet (10 mg total) by mouth daily. 09/01/19   Bast, Gloris Manchester A, NP  fluticasone (FLONASE) 50 MCG/ACT nasal spray Place 1 spray into both nostrils daily. 09/01/19   Dahlia Byes A, NP  nitrofurantoin, macrocrystal-monohydrate, (MACROBID) 100 MG capsule Take 1 capsule (100 mg total) by mouth 2 (two) times daily for 5 days. 09/29/19 10/04/19  Georgetta Haber, NP  Norgestimate-Ethinyl Estradiol  Triphasic (ORTHO TRI-CYCLEN, 28,) 0.18/0.215/0.25 MG-35 MCG tablet Take 1 tablet by mouth daily. 06/07/19   Bing Neighbors, FNP  nystatin cream (MYCOSTATIN) Apply 1 application topically 2 (two) times daily. 08/09/19   Claiborne Rigg, NP  omeprazole (PRILOSEC) 40 MG capsule Take 1 capsule by mouth daily. 07/03/19   [provider]  ondansetron (ZOFRAN ODT) 4 MG disintegrating tablet Take 1 tablet (4 mg total) by mouth every 8 (eight) hours as needed for nausea or vomiting. 06/05/19   Bing Neighbors, FNP  phenazopyridine (PYRIDIUM) 200 MG tablet Take 1 tablet (200 mg total) by mouth 3 (three) times daily. 09/29/19   Georgetta Haber, NP    Family History Family History  Problem Relation Age of Onset  . Drug abuse Maternal Grandmother   . Diabetes Maternal Grandmother   . Hypertension Maternal Grandmother   . Hypertension Paternal Grandmother   . Heart murmur Maternal Aunt   . Stroke Neg Hx   . Cancer Neg Hx     Social History Social History   Tobacco Use  . Smoking status: Current Every Day Smoker  . Smokeless tobacco: Never Used  Substance Use Topics  . Alcohol use:  No    Alcohol/week: 0.0 standard drinks  . Drug use: Not Currently     Allergies   Bee venom   Review of Systems Review of Systems   Physical Exam Triage Vital Signs ED Triage Vitals  Enc Vitals Group     BP 09/29/19 1032 122/80     Pulse Rate 09/29/19 1032 75     Resp 09/29/19 1032 16     Temp 09/29/19 1032 98.5 F (36.9 C)     Temp Source 09/29/19 1032 Oral     SpO2 09/29/19 1032 98 %     Weight --      Height --      Head Circumference --      Peak Flow --      Pain Score 09/29/19 1034 4     Pain Loc --      Pain Edu? --      Excl. in [place]? --    No data found.  Updated Vital Signs BP 122/80 (BP Location: Right Arm)   Pulse 75   Temp 98.5 F (36.9 C) (Oral)   Resp 16   LMP 09/05/2019   SpO2 98%    Physical Exam Constitutional:      General: She is not in acute  distress.    Appearance: She is well-developed.  Cardiovascular:     Rate and Rhythm: Normal rate.  Pulmonary:     Effort: Pulmonary effort is normal.  Abdominal:     Palpations: Abdomen is soft. Abdomen is not rigid.     Tenderness: There is no abdominal tenderness. There is no guarding or rebound.  Genitourinary:    Comments: Denies sores, lesions, vaginal bleeding; no pelvic pain; gu exam deferred at this time, vaginal self swab collected.   Skin:    General: Skin is warm and dry.  Neurological:     Mental Status: She is alert and oriented to person, place, and time.      UC Treatments / Results  Labs (all labs ordered are listed, but only abnormal results are displayed) Labs Reviewed  POCT URINALYSIS DIP (DEVICE) - Abnormal; Notable for the following components:      Result Value   Bilirubin Urine SMALL (*)    Hgb urine dipstick MODERATE (*)    Protein, ur 100 (*)    All other components within normal limits  URINE CULTURE  CERVICOVAGINAL ANCILLARY ONLY    EKG   Radiology No results found.  Procedures Procedures (including critical care time)  Medications Ordered in UC Medications - No data to display  Initial Impression / Assessment and Plan / UC Course  I have reviewed the triage vital signs and the nursing notes.  Pertinent labs & imaging results that were available during my care of the patient were reviewed by me and considered in my medical decision making (see chart for details).     Symptoms are consistent with UTI at this time, although no leuks or nitrite to sample today. hgb is present. Opted to start macrobid pending culture. Vaginal cytology pending. Return precautions provided. Patient verbalized understanding and agreeable to plan.   Final Clinical Impressions(s) / UC Diagnoses   Final diagnoses:  Cystitis     Discharge Instructions     Your urine sample actually looks pretty good today.  However, your symptoms are very consistent  with UTI, therefore we will start treatment, pending the results of a urine culture. If this is also negative you may stop taking the  antibiotics.  We will also swab and test the vagina to ensure there is not source of infection there to cause your symptoms.  Drink plenty of water to empty bladder regularly. Avoid alcohol and caffeine as these may irritate the bladder.      ED Prescriptions    Medication Sig Dispense Auth. Provider   nitrofurantoin, macrocrystal-monohydrate, (MACROBID) 100 MG capsule Take 1 capsule (100 mg total) by mouth 2 (two) times daily for 5 days. 10 capsule Linus MakoBurky, Brnadon Eoff B, NP   phenazopyridine (PYRIDIUM) 200 MG tablet Take 1 tablet (200 mg total) by mouth 3 (three) times daily. 6 tablet Georgetta HaberBurky, Smiley Birr B, NP     PDMP not reviewed this encounter.   Georgetta HaberBurky, Davaughn Hillyard B, NP 09/29/19 1120

## 2019-09-30 LAB — URINE CULTURE: Culture: <10000 — AB

## 2019-10-03 ENCOUNTER — Telehealth (HOSPITAL_COMMUNITY): Payer: Self-pay | Admitting: Emergency Medicine

## 2019-10-03 LAB — CERVICOVAGINAL ANCILLARY ONLY
Bacterial vaginitis: POSITIVE — AB
Candida vaginitis: NEGATIVE
Chlamydia: POSITIVE — AB
Neisseria Gonorrhea: POSITIVE — AB
Trichomonas: NEGATIVE

## 2019-10-03 MED ORDER — METRONIDAZOLE 500 MG PO TABS: 500.0000 mg | ORAL_TABLET | Freq: Two times a day (BID) | ORAL | 0 refills | Status: AC

## 2019-10-03 NOTE — Telephone Encounter (Signed)
Chlamydia and gonorrhea NOT treated Needs 250mg  Rocephin and 1000mg  zithromax together  . Bacterial vaginosis is positive. This was not treated at the urgent care visit.  Flagyl 500 mg BID x 7 days #14 no refills sent to patients pharmacy of choice.    Patient contacted and made aware of    results, all questions answered States she will return tomorrow for treatment.

## 2019-10-04 ENCOUNTER — Encounter (HOSPITAL_COMMUNITY): Payer: Self-pay | Admitting: Emergency Medicine

## 2019-10-04 ENCOUNTER — Ambulatory Visit (HOSPITAL_COMMUNITY)
Admission: EM | Admit: 2019-10-04 | Discharge: 2019-10-04 | Disposition: A | Discharge status: Home / Self Care | Payer: Medicaid Other | Attending: Family Medicine | Admitting: Family Medicine

## 2019-10-04 ENCOUNTER — Other Ambulatory Visit: Payer: Self-pay

## 2019-10-04 MED ORDER — AZITHROMYCIN 250 MG PO TABS
ORAL_TABLET | ORAL | Status: AC
  Filled 2019-10-04: qty 4

## 2019-10-04 MED ORDER — AZITHROMYCIN 250 MG PO TABS
1000.0000 mg | ORAL_TABLET | Freq: Once | ORAL | Status: AC
  Administered 2019-10-04: 13:00:00 1000 mg via ORAL

## 2019-10-04 MED ORDER — CEFTRIAXONE SODIUM 250 MG IJ SOLR
INTRAMUSCULAR | Status: AC
  Filled 2019-10-04: qty 250

## 2019-10-04 MED ORDER — CEFTRIAXONE SODIUM 250 MG IJ SOLR
250.0000 mg | Freq: Once | INTRAMUSCULAR | Status: AC
  Administered 2019-10-04: 250 mg via INTRAMUSCULAR

## 2019-10-04 MED ORDER — STERILE WATER FOR INJECTION IJ SOLN
INTRAMUSCULAR | Status: AC
  Filled 2019-10-04: qty 10

## 2019-10-04 NOTE — ED Triage Notes (Signed)
PT here for treatment. Will get azithromycin and rocephin nurse visit. Made aware of flagyl prescription and given instructions.

## 2019-10-17 ENCOUNTER — Ambulatory Visit: Payer: Medicaid Other | Admitting: Registered"

## 2019-10-27 ENCOUNTER — Ambulatory Visit: Payer: Medicaid Other | Admitting: Registered"

## 2019-12-14 ENCOUNTER — Encounter (HOSPITAL_COMMUNITY): Payer: Self-pay

## 2019-12-14 ENCOUNTER — Other Ambulatory Visit: Payer: Self-pay

## 2019-12-14 ENCOUNTER — Ambulatory Visit (HOSPITAL_COMMUNITY)
Admission: EM | Admit: 2019-12-14 | Discharge: 2019-12-14 | Disposition: A | Discharge status: Home / Self Care | Payer: Medicaid Other | Attending: Family Medicine | Admitting: Family Medicine

## 2019-12-14 DIAGNOSIS — Z20828 Contact with and (suspected) exposure to other viral communicable diseases: Secondary | ICD-10-CM

## 2019-12-14 DIAGNOSIS — U071 COVID-19: Secondary | ICD-10-CM | POA: Insufficient documentation

## 2019-12-14 DIAGNOSIS — Z20822 Contact with and (suspected) exposure to covid-19: Secondary | ICD-10-CM

## 2019-12-14 NOTE — ED Triage Notes (Signed)
Pt. Is here denying ANY symptoms/exposures just wants COVID testing.

## 2019-12-14 NOTE — Discharge Instructions (Signed)
You can check for your test result on My Chart

## 2019-12-14 NOTE — ED Provider Notes (Signed)
[place]    CSN: [id number] Arrival date & time: 12/14/19  1609      History   Chief Complaint Chief Complaint  Patient presents with  . COVID testing    HPI Terri Potts is a 18 y.o. female.   HPI  Patient denies exposure to Covid.  She denies Covid symptoms.  She would like Covid testing.  She works at [employer].  She has heard that this is a place for Covid might be encountered.  Past Medical History:  Diagnosis Date  . History of anemia   . Seasonal allergies     Patient Active Problem List   Diagnosis Date Noted  . Chronic vomiting 02/13/2019  . Chronic nausea 02/13/2019  . Loss of appetite 02/13/2019  . Gastroesophageal reflux disease with esophagitis 02/13/2019  . Unintended weight loss 02/13/2019    History reviewed. No pertinent surgical history.  OB History    Gravida  0   Para  0   Term  0   Preterm  0   AB  0   Living  0     SAB  0   TAB  0   Ectopic  0   Multiple  0   Live Births               Home Medications    Prior to Admission medications   Medication Sig Start Date End Date Taking? Authorizing Provider  cetirizine (ZYRTEC) 10 MG tablet Take 1 tablet (10 mg total) by mouth daily. 09/01/19 12/14/19  Loura Halt A, NP  fluticasone (FLONASE) 50 MCG/ACT nasal spray Place 1 spray into both nostrils daily. 09/01/19 12/14/19  Orvan July, NP  Norgestimate-Ethinyl Estradiol Triphasic (ORTHO TRI-CYCLEN, 28,) 0.18/0.215/0.25 MG-35 MCG tablet Take 1 tablet by mouth daily. 06/07/19 12/14/19  Scot Jun, FNP    Family History Family History  Problem Relation Age of Onset  . Drug abuse Maternal Grandmother   . Diabetes Maternal Grandmother   . Hypertension Maternal Grandmother   . Hypertension Paternal Grandmother   . Heart murmur Maternal Aunt   . Bronchitis Mother   . Stroke Neg Hx   . Cancer Neg Hx     Social History Social History   Tobacco Use  . Smoking status: Current Every Day Smoker    . Smokeless tobacco: Never Used  Substance Use Topics  . Alcohol use: Yes    Alcohol/week: 0.0 standard drinks  . Drug use: Not Currently     Allergies   Bee venom   Review of Systems Review of Systems  Constitutional: Negative for chills and fever.  HENT: Negative for congestion and hearing loss.   Eyes: Negative for pain.  Respiratory: Negative for cough and shortness of breath.   Cardiovascular: Negative for chest pain and leg swelling.  Gastrointestinal: Negative for abdominal pain, constipation and diarrhea.  Genitourinary: Negative for dysuria and frequency.  Musculoskeletal: Negative for myalgias.  Neurological: Negative for dizziness, seizures and headaches.  Psychiatric/Behavioral: The patient is not nervous/anxious.      Physical Exam Triage Vital Signs ED Triage Vitals  Enc Vitals Group     BP 12/14/19 1755 116/72     Pulse Rate 12/14/19 1755 96     Resp 12/14/19 1755 16     Temp 12/14/19 1755 98.8 F (37.1 C)     Temp Source 12/14/19 1755 Oral     SpO2 12/14/19 1755 100 %     Weight 12/14/19 1752 138  lb 12.8 oz (63 kg)     Height --      Head Circumference --      Peak Flow --      Pain Score 12/14/19 1752 0     Pain Loc --      Pain Edu? --      Excl. in [place]? --    No data found.  Updated Vital Signs BP 116/72 (BP Location: Right Arm)   Pulse 96   Temp 98.8 F (37.1 C) (Oral)   Resp 16   Wt 63 kg   LMP 11/08/2019   SpO2 100%   BMI 24.20 kg/m   Visual Acuity Right Eye Distance:   Left Eye Distance:   Bilateral Distance:    Right Eye Near:   Left Eye Near:    Bilateral Near:     Physical Exam Constitutional:      General: She is not in acute distress.    Appearance: She is well-developed.  HENT:     Head: Normocephalic and atraumatic.  Eyes:     Conjunctiva/sclera: Conjunctivae normal.     Pupils: Pupils are equal, round, and reactive to light.  Cardiovascular:     Rate and Rhythm: Normal rate.  Pulmonary:     Effort:  Pulmonary effort is normal. No respiratory distress.  Abdominal:     General: There is no distension.     Palpations: Abdomen is soft.  Musculoskeletal:        General: Normal range of motion.     Cervical back: Normal range of motion.  Skin:    General: Skin is warm and dry.  Neurological:     Mental Status: She is alert.      UC Treatments / Results  Labs (all labs ordered are listed, but only abnormal results are displayed) Labs Reviewed  NOVEL CORONAVIRUS, NAA (HOSP ORDER, SEND-OUT TO REF LAB; TAT 18-24 HRS)    EKG   Radiology No results found.  Procedures Procedures (including critical care time)  Medications Ordered in UC Medications - No data to display  Initial Impression / Assessment and Plan / UC Course  I have reviewed the triage vital signs and the nursing notes.  Pertinent labs & imaging results that were available during my care of the patient were reviewed by me and considered in my medical decision making (see chart for details).     Information provided Final Clinical Impressions(s) / UC Diagnoses   Final diagnoses:  Encounter for screening laboratory testing for COVID-19 virus     Discharge Instructions     You can check for your test result on My Chart   ED Prescriptions    None     PDMP not reviewed this encounter.   Eustace Moore, MD 12/14/19 [phone]

## 2019-12-15 ENCOUNTER — Telehealth (HOSPITAL_COMMUNITY): Payer: Self-pay | Admitting: Emergency Medicine

## 2019-12-15 LAB — NOVEL CORONAVIRUS, NAA (HOSP ORDER, SEND-OUT TO REF LAB; TAT 18-24 HRS): SARS-CoV-2, NAA: DETECTED — AB

## 2019-12-15 NOTE — Telephone Encounter (Signed)

## 2019-12-22 ENCOUNTER — Emergency Department (HOSPITAL_COMMUNITY)
Admission: EM | Admit: 2019-12-22 | Discharge: 2019-12-22 | Disposition: A | Discharge status: Home / Self Care | Payer: Medicaid Other | Attending: Emergency Medicine | Admitting: Emergency Medicine

## 2019-12-22 ENCOUNTER — Other Ambulatory Visit: Payer: Self-pay

## 2019-12-22 ENCOUNTER — Encounter (HOSPITAL_COMMUNITY): Payer: Self-pay | Admitting: Emergency Medicine

## 2019-12-22 DIAGNOSIS — Z20822 Contact with and (suspected) exposure to covid-19: Secondary | ICD-10-CM

## 2019-12-22 DIAGNOSIS — F17200 Nicotine dependence, unspecified, uncomplicated: Secondary | ICD-10-CM | POA: Diagnosis not present

## 2019-12-22 DIAGNOSIS — U071 COVID-19: Secondary | ICD-10-CM | POA: Diagnosis not present

## 2019-12-22 DIAGNOSIS — R430 Anosmia: Secondary | ICD-10-CM | POA: Diagnosis present

## 2019-12-22 NOTE — Discharge Instructions (Signed)
Return to the ED with any concerns including difficulty breathing, vomiting, fainting, decreased level of alertness/lethargy, or any other alarming symptoms °

## 2019-12-22 NOTE — ED Triage Notes (Signed)
Patient brought in by mother.  Siblings also being seen.  Patient reports she tested positive for covid and today is last day of quarantine.  No meds PTA.  Wants to be tested so she can return to work.  Reports loss of smell.  No other symptoms per patient.

## 2019-12-22 NOTE — ED Provider Notes (Signed)
[facility] EMERGENCY DEPARTMENT Provider Note   CSN: [id number] Arrival date & time: 12/22/19  1319     History Chief Complaint  Patient presents with  . loss of smell    Terri Potts is a 19 y.o. female.  HPI  Pt with hx of covid positivity.  She was tested at the end of December at urgent care and was positive.  Today is the last day of her quarantine and she is presenting to the ED to have another test so that she can return to work.  She reports loss of smell but no other active symptoms at this time.  There are no other associated systemic symptoms, there are no other alleviating or modifying factors.      Past Medical History:  Diagnosis Date  . History of anemia   . Seasonal allergies     Patient Active Problem List   Diagnosis Date Noted  . Chronic vomiting 02/13/2019  . Chronic nausea 02/13/2019  . Loss of appetite 02/13/2019  . Gastroesophageal reflux disease with esophagitis 02/13/2019  . Unintended weight loss 02/13/2019    History reviewed. No pertinent surgical history.   OB History    Gravida  0   Para  0   Term  0   Preterm  0   AB  0   Living  0     SAB  0   TAB  0   Ectopic  0   Multiple  0   Live Births              Family History  Problem Relation Age of Onset  . Drug abuse Maternal Grandmother   . Diabetes Maternal Grandmother   . Hypertension Maternal Grandmother   . Hypertension Paternal Grandmother   . Heart murmur Maternal Aunt   . Bronchitis Mother   . Stroke Neg Hx   . Cancer Neg Hx     Social History   Tobacco Use  . Smoking status: Current Every Day Smoker  . Smokeless tobacco: Never Used  Substance Use Topics  . Alcohol use: Yes    Alcohol/week: 0.0 standard drinks  . Drug use: Not Currently    Home Medications Prior to Admission medications   Medication Sig Start Date End Date Taking? Authorizing Provider  cetirizine (ZYRTEC) 10 MG tablet Take 1 tablet (10 mg total) by mouth  daily. 09/01/19 12/14/19  Dahlia Byes A, NP  fluticasone (FLONASE) 50 MCG/ACT nasal spray Place 1 spray into both nostrils daily. 09/01/19 12/14/19  Janace Aris, NP  Norgestimate-Ethinyl Estradiol Triphasic (ORTHO TRI-CYCLEN, 28,) 0.18/0.215/0.25 MG-35 MCG tablet Take 1 tablet by mouth daily. 06/07/19 12/14/19  Bing Neighbors, FNP    Allergies    Bee venom  Review of Systems   Review of Systems  ROS reviewed and all otherwise negative except for mentioned in HPI  Physical Exam Updated Vital Signs BP 131/80 (BP Location: Right Arm)   Pulse 85   Temp 98.2 F (36.8 C) (Oral)   Resp 16   SpO2 98%  Vitals reviewed Physical Exam  Physical Examination: GENERAL ASSESSMENT: active, alert, no acute distress, well hydrated, well nourished SKIN: no lesions, jaundice, petechiae, pallor, cyanosis, ecchymosis HEAD: Atraumatic, normocephalic EYES: no conjunctival injection, no scleral icterus CHEST: normal respiratory effort EXTREMITY: Normal muscle tone. No swelling NEURO: normal tone  ED Results / Procedures / Treatments   Labs (all labs ordered are listed, but only abnormal results are displayed) Labs Reviewed  NOVEL CORONAVIRUS, NAA (HOSP ORDER, SEND-OUT TO REF LAB; TAT 18-24 HRS)    EKG None  Radiology No results found.  Procedures Procedures (including critical care time)  Medications Ordered in ED Medications - No data to display  ED Course  I have reviewed the triage vital signs and the nursing notes.  Pertinent labs & imaging results that were available during my care of the patient were reviewed by me and considered in my medical decision making (see chart for details).    MDM Rules/Calculators/A&P                      Pt presenting due to requesting covid testing for return to work.  Pt has loss of smell but no other active symptoms at this time.  Pt discharged with strict return precautions.  Mom agreeable with plan Final Clinical Impression(s) / ED  Diagnoses Final diagnoses:  Encounter for laboratory testing for COVID-19 virus    Rx / DC Orders ED Discharge Orders    None       Valerio Pinard, Forbes Cellar, MD 12/22/19 1454

## 2019-12-27 ENCOUNTER — Telehealth (HOSPITAL_COMMUNITY): Payer: Self-pay

## 2019-12-27 LAB — NOVEL CORONAVIRUS, NAA (HOSP ORDER, SEND-OUT TO REF LAB; TAT 18-24 HRS): SARS-CoV-2, NAA: DETECTED — AB

## 2020-05-12 ENCOUNTER — Encounter (HOSPITAL_COMMUNITY): Payer: Self-pay | Admitting: Emergency Medicine

## 2020-05-12 ENCOUNTER — Other Ambulatory Visit: Payer: Self-pay

## 2020-05-12 ENCOUNTER — Ambulatory Visit (HOSPITAL_COMMUNITY)
Admission: EM | Admit: 2020-05-12 | Discharge: 2020-05-12 | Disposition: A | Discharge status: Home / Self Care | Payer: Medicaid Other | Attending: Physician Assistant | Admitting: Physician Assistant

## 2020-05-12 DIAGNOSIS — B354 Tinea corporis: Secondary | ICD-10-CM | POA: Diagnosis not present

## 2020-05-12 DIAGNOSIS — S0511XA Contusion of eyeball and orbital tissues, right eye, initial encounter: Secondary | ICD-10-CM | POA: Diagnosis not present

## 2020-05-12 DIAGNOSIS — S0990XA Unspecified injury of head, initial encounter: Secondary | ICD-10-CM | POA: Diagnosis not present

## 2020-05-12 DIAGNOSIS — R55 Syncope and collapse: Secondary | ICD-10-CM | POA: Diagnosis not present

## 2020-05-12 MED ORDER — KETOCONAZOLE 2 % EX CREA: 1.0000 "application " | TOPICAL_CREAM | Freq: Every day | CUTANEOUS | 0 refills | Status: DC

## 2020-05-12 NOTE — ED Triage Notes (Signed)
Pt c/o loc on Thursday and hit her head on a bench. She states she was unconscious for 10-15 seconds.

## 2020-05-12 NOTE — Discharge Instructions (Addendum)
Go to ED with worsening symptoms. Follow up with PCP. Ice head 15 minutes 4 times per day.

## 2020-05-12 NOTE — ED Provider Notes (Signed)
[facility]    CSN: [id number] Arrival date & time: 05/12/20  1116      History   Chief Complaint Chief Complaint  Patient presents with  . Loss of Consciousness    HPI Terri Potts is a 19 y.o. female.   Patient here c/w "loss of consciousness" x 4 days ago. She was walking into work when she felt like she was going to pass out.  There is a bench near her work so she tried to sit on it but missed and hit her head on the bench.  She was unconscious for 10 - 15 seconds.  She stated she felt very diaphoretic and dizzy after the fall, which has resolved. Denies CP, SOB, vision changes, blurry vision, double vision, weakness, n/t, n/v.  She states this has happened several times before and that she has spoken to her PCP about it, but doesn't feel they took her concerns seriously.    She notes she has some pain and tenderness R eyebrow.  Admits swelling, which has improved, denies ecchymosis.  She is also concern with "rash" on arms and legs b/l x several weeks - months.  deneis itchiness, pain, tenderness, swelling, discharge.     Past Medical History:  Diagnosis Date  . History of anemia   . Seasonal allergies     Patient Active Problem List   Diagnosis Date Noted  . Chronic vomiting 02/13/2019  . Chronic nausea 02/13/2019  . Loss of appetite 02/13/2019  . Gastroesophageal reflux disease with esophagitis 02/13/2019  . Unintended weight loss 02/13/2019    History reviewed. No pertinent surgical history.  OB History    Gravida  0   Para  0   Term  0   Preterm  0   AB  0   Living  0     SAB  0   TAB  0   Ectopic  0   Multiple  0   Live Births               Home Medications    Prior to Admission medications   Medication Sig Start Date End Date Taking? Authorizing Provider  ketoconazole (NIZORAL) 2 % cream Apply 1 application topically daily. 05/12/20   Evern Core, PA-C  cetirizine (ZYRTEC) 10 MG tablet Take 1 tablet (10 mg total)  by mouth daily. 09/01/19 12/14/19  Dahlia Byes A, NP  fluticasone (FLONASE) 50 MCG/ACT nasal spray Place 1 spray into both nostrils daily. 09/01/19 12/14/19  Janace Aris, NP  Norgestimate-Ethinyl Estradiol Triphasic (ORTHO TRI-CYCLEN, 28,) 0.18/0.215/0.25 MG-35 MCG tablet Take 1 tablet by mouth daily. 06/07/19 12/14/19  Bing Neighbors, FNP    Family History Family History  Problem Relation Age of Onset  . Drug abuse Maternal Grandmother   . Diabetes Maternal Grandmother   . Hypertension Maternal Grandmother   . Hypertension Paternal Grandmother   . Heart murmur Maternal Aunt   . Bronchitis Mother   . Stroke Neg Hx   . Cancer Neg Hx     Social History Social History   Tobacco Use  . Smoking status: Current Every Day Smoker  . Smokeless tobacco: Never Used  Substance Use Topics  . Alcohol use: Yes    Alcohol/week: 0.0 standard drinks  . Drug use: Not Currently     Allergies   Bee venom   Review of Systems Review of Systems  Constitutional: Negative for chills, diaphoresis, fatigue and fever.  HENT: Positive for facial swelling. Negative  for ear pain, nosebleeds, postnasal drip, rhinorrhea and sore throat.   Eyes: Negative for photophobia, pain, discharge, redness and visual disturbance.  Respiratory: Negative for cough, choking, shortness of breath and wheezing.   Cardiovascular: Negative for chest pain, palpitations and leg swelling.  Gastrointestinal: Negative for abdominal pain, nausea and vomiting.  Musculoskeletal: Positive for myalgias. Negative for arthralgias, back pain, gait problem, neck pain and neck stiffness.  Skin: Positive for rash. Negative for color change.  Neurological: Negative for dizziness, seizures, syncope, weakness, light-headedness and headaches.  Hematological: Negative for adenopathy. Does not bruise/bleed easily.  Psychiatric/Behavioral: Negative for agitation, confusion, decreased concentration and dysphoric mood.  All other systems  reviewed and are negative.    Physical Exam Triage Vital Signs ED Triage Vitals  Enc Vitals Group     BP 05/12/20 1128 138/63     Pulse Rate 05/12/20 1128 81     Resp 05/12/20 1128 14     Temp 05/12/20 1128 98.3 F (36.8 C)     Temp Source 05/12/20 1128 Oral     SpO2 05/12/20 1128 100 %     Weight --      Height --      Head Circumference --      Peak Flow --      Pain Score 05/12/20 1129 5     Pain Loc --      Pain Edu? --      Excl. in [place]? --    No data found.  Updated Vital Signs BP 138/63 (BP Location: Left Arm)   Pulse 81   Temp 98.3 F (36.8 C) (Oral)   Resp 14   LMP 04/05/2020 (Approximate)   SpO2 100%   Visual Acuity Right Eye Distance:   Left Eye Distance:   Bilateral Distance:    Right Eye Near:   Left Eye Near:    Bilateral Near:     Physical Exam Vitals and nursing note reviewed.  Constitutional:      General: She is not in acute distress.    Appearance: She is well-developed.  HENT:     Head: Normocephalic. Contusion present. No raccoon eyes, Battle's sign, abrasion, right periorbital erythema, left periorbital erythema or laceration.      Right Ear: Tympanic membrane normal.     Left Ear: Tympanic membrane normal.     Nose: Nose normal. No nasal deformity, signs of injury or nasal tenderness.     Mouth/Throat:     Pharynx: Oropharynx is clear.  Eyes:     General: Lids are normal. Lids are everted, no foreign bodies appreciated. Vision grossly intact. Gaze aligned appropriately. No allergic shiner, visual field deficit or scleral icterus.    Extraocular Movements:     Right eye: Normal extraocular motion and no nystagmus.     Left eye: Normal extraocular motion and no nystagmus.     Conjunctiva/sclera: Conjunctivae normal.     Right eye: Right conjunctiva is not injected. No exudate or hemorrhage.    Left eye: Left conjunctiva is not injected. No exudate or hemorrhage.    Pupils: Pupils are equal, round, and reactive to light. Pupils are  equal.     Funduscopic exam:    Right eye: No hemorrhage or papilledema.        Left eye: No hemorrhage or papilledema.  Cardiovascular:     Rate and Rhythm: Normal rate and regular rhythm.     Heart sounds: No murmur.  Pulmonary:     Effort: Pulmonary effort  is normal. No respiratory distress.     Breath sounds: Normal breath sounds.  Musculoskeletal:        General: No swelling, tenderness or deformity. Normal range of motion.     Cervical back: Full passive range of motion without pain, normal range of motion and neck supple. No signs of trauma, rigidity or crepitus. No pain with movement, spinous process tenderness or muscular tenderness. Normal range of motion.  Skin:    General: Skin is warm and dry.     Capillary Refill: Capillary refill takes less than 2 seconds.     Findings: Rash present. Rash is macular. Rash is not nodular, pustular, scaling or urticarial.       Neurological:     General: No focal deficit present.     Mental Status: She is alert and oriented to person, place, and time.     GCS: GCS eye subscore is 4. GCS verbal subscore is 5. GCS motor subscore is 6.     Cranial Nerves: Cranial nerves are intact. No cranial nerve deficit or facial asymmetry.     Sensory: Sensation is intact.     Motor: Motor function is intact. No weakness, tremor or abnormal muscle tone.     Coordination: Coordination is intact.     Gait: Gait is intact. Gait and tandem walk normal.     Deep Tendon Reflexes:     Reflex Scores:      Patellar reflexes are 2+ on the right side and 2+ on the left side. Psychiatric:        Mood and Affect: Mood normal.        Behavior: Behavior normal.      UC Treatments / Results  Labs (all labs ordered are listed, but only abnormal results are displayed) Labs Reviewed - No data to display  EKG   Radiology No results found.  Procedures Procedures (including critical care time)  Medications Ordered in UC Medications - No data to  display  Initial Impression / Assessment and Plan / UC Course  I have reviewed the triage vital signs and the nursing notes.  Pertinent labs & imaging results that were available during my care of the patient were reviewed by me and considered in my medical decision making (see chart for details).     ED precautions provided. Follow up with PCP. Ice head as discussed. Use cream as directed. Final Clinical Impressions(s) / UC Diagnoses   Final diagnoses:  Syncope and collapse  Injury of head, initial encounter  Contusion of globe of right eye, initial encounter  Ringworm of body     Discharge Instructions     Go to ED with worsening symptoms. Follow up with PCP. Ice head 15 minutes 4 times per day.     ED Prescriptions    Medication Sig Dispense Auth. Provider   ketoconazole (NIZORAL) 2 % cream Apply 1 application topically daily. 30 g Evern Core, PA-C     PDMP not reviewed this encounter.   Evern Core, PA-C 05/12/20 1226

## 2020-07-04 ENCOUNTER — Telehealth (INDEPENDENT_AMBULATORY_CARE_PROVIDER_SITE_OTHER): Payer: Medicaid Other | Admitting: Internal Medicine

## 2020-07-04 ENCOUNTER — Encounter: Payer: Self-pay | Admitting: Internal Medicine

## 2020-07-04 ENCOUNTER — Other Ambulatory Visit: Payer: Medicaid Other

## 2020-07-04 DIAGNOSIS — Z113 Encounter for screening for infections with a predominantly sexual mode of transmission: Secondary | ICD-10-CM | POA: Diagnosis not present

## 2020-07-04 DIAGNOSIS — N898 Other specified noninflammatory disorders of vagina: Secondary | ICD-10-CM

## 2020-07-04 DIAGNOSIS — R21 Rash and other nonspecific skin eruption: Secondary | ICD-10-CM | POA: Diagnosis not present

## 2020-07-04 MED ORDER — TERBINAFINE HCL 250 MG PO TABS: 250.0000 mg | ORAL_TABLET | Freq: Every day | ORAL | 0 refills | Status: DC

## 2020-07-04 MED ORDER — TRIAMCINOLONE ACETONIDE 0.1 % EX CREA: 1.0000 "application " | TOPICAL_CREAM | Freq: Two times a day (BID) | CUTANEOUS | 0 refills | Status: DC

## 2020-07-04 NOTE — Progress Notes (Signed)
Virtual Visit via Telephone Note  I connected with Terri Potts, on 07/04/2020 at 10:34 AM by MyChart due to the COVID-19 pandemic and verified that I am speaking with the correct person using two identifiers.   Consent: I discussed the limitations, risks, security and privacy concerns of performing an evaluation and management service by MyChart video and the availability of in person appointments. I also discussed with the patient that there may be a patient responsible charge related to this service. The patient expressed understanding and agreed to proceed.   Location of Patient: Home   Location of Provider: Clinic    Persons participating in Telemedicine visit: Brihanna Devenport [facility] Dr. Earlene Plater      History of Present Illness: Patient has a visit for concern about a rash. Reports she was seen in Urgent Care for the same rash in May. Was initially told it was eczema, then said it might be ringworm---was treated with Ketoconazole. Patient completed therapy for a month with application one time per daywith no improvement. Patient reports that rash has been present since Feb or March. Mainly on her legs. A couple lesions on her arms. No other contacts with similar rash. Did have a dog in the house for several months. Does not itch or burn. Lesions are circular or oval. Reports that they are crusty. Doesn't seem to have central clearing. No bleeding of lesions. Never had a rash like this before.    Also has concerns about vaginal odor. Is sexually active but uses protection. Would like to be screened for STDs.    Past Medical History:  Diagnosis Date   History of anemia    Seasonal allergies    Allergies  Allergen Reactions   Bee Venom Swelling    At site of bite    Current Outpatient Medications on File Prior to Visit  Medication Sig Dispense Refill   ketoconazole (NIZORAL) 2 % cream Apply 1 application topically daily. 30 g 0   [DISCONTINUED] cetirizine  (ZYRTEC) 10 MG tablet Take 1 tablet (10 mg total) by mouth daily. 30 tablet 0   [DISCONTINUED] fluticasone (FLONASE) 50 MCG/ACT nasal spray Place 1 spray into both nostrils daily. 16 g 2   [DISCONTINUED] Norgestimate-Ethinyl Estradiol Triphasic (ORTHO TRI-CYCLEN, 28,) 0.18/0.215/0.25 MG-35 MCG tablet Take 1 tablet by mouth daily. 1 Package 11   No current facility-administered medications on file prior to visit.    Observations/Objective: NAD. Speaking clearly.  Work of breathing normal.  Has several hyperpigmented lesions with raised borders.  Alert and oriented. Mood appropriate.   Assessment and Plan: 1. Rash and nonspecific skin eruption Difficult to determine if ringworm or eczema with video quality. Interestingly, she does not have pruritis which would expect with either scenario. Will treat with PO antifungal given presumed topical treatment failure and extensive skin involvement. Will also give topical corticosteroid. If no improvement within a couple weeks of treatment, would refer to dermatology.  - terbinafine (LAMISIL) 250 MG tablet; Take 1 tablet (250 mg total) by mouth daily.  Dispense: 14 tablet; Refill: 0 - triamcinolone cream (KENALOG) 0.1 %; Apply 1 application topically 2 (two) times daily.  Dispense: 30 g; Refill: 0  2. Screening for STD (sexually transmitted disease) - RPR; Future - Cervicovaginal ancillary only; Future - HIV Antibody (routine testing w rflx); Future  3. Vaginal odor - Cervicovaginal ancillary only; Future   Follow Up Instructions: PRN and for routine medical care    I discussed the assessment and treatment plan with  the patient. The patient was provided an opportunity to ask questions and all were answered. The patient agreed with the plan and demonstrated an understanding of the instructions.   The patient was advised to call back or seek an in-person evaluation if the symptoms worsen or if the condition fails to improve as  anticipated.     I provided 16 minutes total of non-face-to-face time during this encounter including median intraservice time, reviewing previous notes, investigations, ordering medications, medical decision making, coordinating care and patient verbalized understanding at the end of the visit.    Marcy Siren, D.O. [facility]  07/04/2020, 10:34 AM

## 2020-07-08 ENCOUNTER — Other Ambulatory Visit: Payer: Self-pay

## 2020-07-08 ENCOUNTER — Other Ambulatory Visit (INDEPENDENT_AMBULATORY_CARE_PROVIDER_SITE_OTHER): Payer: Medicaid Other

## 2020-07-08 ENCOUNTER — Other Ambulatory Visit (HOSPITAL_COMMUNITY)
Admission: RE | Admit: 2020-07-08 | Discharge: 2020-07-08 | Disposition: A | Discharge status: Home / Self Care | Payer: Medicaid Other | Source: Ambulatory Visit | Attending: Internal Medicine | Admitting: Internal Medicine

## 2020-07-08 DIAGNOSIS — Z113 Encounter for screening for infections with a predominantly sexual mode of transmission: Secondary | ICD-10-CM | POA: Diagnosis not present

## 2020-07-08 DIAGNOSIS — N898 Other specified noninflammatory disorders of vagina: Secondary | ICD-10-CM | POA: Insufficient documentation

## 2020-07-09 LAB — RPR: RPR Ser Ql: NONREACTIVE

## 2020-07-09 LAB — HIV ANTIBODY (ROUTINE TESTING W REFLEX): HIV Screen 4th Generation wRfx: NONREACTIVE

## 2020-07-10 LAB — CERVICOVAGINAL ANCILLARY ONLY
Bacterial Vaginitis (gardnerella): POSITIVE — AB
Candida Glabrata: NEGATIVE
Candida Vaginitis: POSITIVE — AB
Chlamydia: NEGATIVE
Comment: NEGATIVE
Comment: NEGATIVE
Comment: NEGATIVE
Comment: NEGATIVE
Comment: NEGATIVE
Comment: NORMAL
Neisseria Gonorrhea: NEGATIVE
Trichomonas: NEGATIVE

## 2020-07-11 ENCOUNTER — Other Ambulatory Visit: Payer: Self-pay | Admitting: Internal Medicine

## 2020-07-11 MED ORDER — FLUCONAZOLE 150 MG PO TABS: ORAL_TABLET | ORAL | 0 refills | Status: DC

## 2020-07-11 MED ORDER — METRONIDAZOLE 500 MG PO TABS: 500.0000 mg | ORAL_TABLET | Freq: Two times a day (BID) | ORAL | 0 refills | Status: DC

## 2020-07-29 ENCOUNTER — Ambulatory Visit (INDEPENDENT_AMBULATORY_CARE_PROVIDER_SITE_OTHER): Payer: Medicaid Other | Admitting: Physician Assistant

## 2020-07-29 ENCOUNTER — Other Ambulatory Visit: Payer: Self-pay

## 2020-07-29 VITALS — BP 123/76 | HR 76 | Temp 97.3°F | Resp 17 | Wt 141.0 lb

## 2020-07-29 DIAGNOSIS — Z3490 Encounter for supervision of normal pregnancy, unspecified, unspecified trimester: Secondary | ICD-10-CM | POA: Diagnosis not present

## 2020-07-29 DIAGNOSIS — N926 Irregular menstruation, unspecified: Secondary | ICD-10-CM

## 2020-07-29 DIAGNOSIS — Z716 Tobacco abuse counseling: Secondary | ICD-10-CM

## 2020-07-29 DIAGNOSIS — R21 Rash and other nonspecific skin eruption: Secondary | ICD-10-CM | POA: Diagnosis not present

## 2020-07-29 LAB — POCT URINE PREGNANCY: Preg Test, Ur: POSITIVE — AB

## 2020-07-29 MED ORDER — TRIAMCINOLONE ACETONIDE 0.1 % EX CREA: 1.0000 "application " | TOPICAL_CREAM | Freq: Two times a day (BID) | CUTANEOUS | 0 refills | Status: DC

## 2020-07-29 NOTE — Progress Notes (Signed)
Established Patient Office Visit  Subjective:  Patient ID: Terri Potts, female    DOB: January 15, 2001  Age: 19 y.o. MRN: [id number]  CC:  Chief Complaint  Patient presents with  . Possible Pregnancy    2 weeks late    HPI Terri Potts reports that her menses is late by 2 weeks, she took 2 home pregnancy test that were both positive.  Reports she has been having some breast tenderness, nausea, has not tried anything for relief  Does smoke black and milds, does plan on immediate smoking cessation.  Was previously treated for rash at the end of July 2021, states that the cream prescribed  has offered relief, does still have a couple small spots and requests a refill.     Past Medical History:  Diagnosis Date  . History of anemia   . Seasonal allergies     History reviewed. No pertinent surgical history.  Family History  Problem Relation Age of Onset  . Drug abuse Maternal Grandmother   . Diabetes Maternal Grandmother   . Hypertension Maternal Grandmother   . Hypertension Paternal Grandmother   . Heart murmur Maternal Aunt   . Bronchitis Mother   . Stroke Neg Hx   . Cancer Neg Hx     Social History   Socioeconomic History  . Marital status: Single    Spouse name: Not on file  . Number of children: Not on file  . Years of education: Not on file  . Highest education level: Not on file  Occupational History  . Not on file  Tobacco Use  . Smoking status: Current Every Day Smoker  . Smokeless tobacco: Never Used  Substance and Sexual Activity  . Alcohol use: Yes    Alcohol/week: 0.0 standard drinks  . Drug use: Not Currently  . Sexual activity: Yes    Birth control/protection: None  Other Topics Concern  . Not on file  Social History Narrative  . Not on file   Social Determinants of Health   Financial Resource Strain:   . Difficulty of Paying Living Expenses:   Food Insecurity:   . Worried About Programme researcher, broadcasting/film/video in the Last Year:   . Barista in  the Last Year:   Transportation Needs:   . Freight forwarder (Medical):   Marland Kitchen Lack of Transportation (Non-Medical):   Physical Activity:   . Days of Exercise per Week:   . Minutes of Exercise per Session:   Stress:   . Feeling of Stress :   Social Connections:   . Frequency of Communication with Friends and Family:   . Frequency of Social Gatherings with Friends and Family:   . Attends Religious Services:   . Active Member of Clubs or Organizations:   . Attends Banker Meetings:   Marland Kitchen Marital Status:   Intimate Partner Violence:   . Fear of Current or Ex-Partner:   . Emotionally Abused:   Marland Kitchen Physically Abused:   . Sexually Abused:     Outpatient Medications Prior to Visit  Medication Sig Dispense Refill  . triamcinolone cream (KENALOG) 0.1 % Apply 1 application topically 2 (two) times daily. 30 g 0  . fluconazole (DIFLUCAN) 150 MG tablet Take one tablet now. Take second tablet in 72 hours if still feeling symptoms. 2 tablet 0  . ketoconazole (NIZORAL) 2 % cream Apply 1 application topically daily. 30 g 0  . metroNIDAZOLE (FLAGYL) 500 MG tablet Take 1 tablet (500  mg total) by mouth 2 (two) times daily. 14 tablet 0  . terbinafine (LAMISIL) 250 MG tablet Take 1 tablet (250 mg total) by mouth daily. 14 tablet 0   No facility-administered medications prior to visit.    Allergies  Allergen Reactions  . Bee Venom Swelling    At site of bite    ROS Review of Systems  Constitutional: Negative for chills and fever.  HENT: Negative.   Eyes: Negative.   Respiratory: Negative.   Cardiovascular: Negative.   Gastrointestinal: Positive for nausea. Negative for vomiting.  Endocrine: Negative.   Genitourinary: Negative.   Musculoskeletal: Negative.   Allergic/Immunologic: Negative.   Neurological: Negative.   Hematological: Negative.   Psychiatric/Behavioral: Negative.       Objective:    Physical Exam Vitals and nursing note reviewed.  Constitutional:       General: She is not in acute distress.    Appearance: Normal appearance. She is not ill-appearing.  HENT:     Head: Normocephalic and atraumatic.     Right Ear: External ear normal.     Left Ear: External ear normal.     Nose: Nose normal.     Mouth/Throat:     Mouth: Mucous membranes are moist.     Pharynx: Oropharynx is clear.  Eyes:     Extraocular Movements: Extraocular movements intact.     Conjunctiva/sclera: Conjunctivae normal.     Pupils: Pupils are equal, round, and reactive to light.  Cardiovascular:     Rate and Rhythm: Normal rate and regular rhythm.     Pulses: Normal pulses.  Pulmonary:     Effort: Pulmonary effort is normal.  Abdominal:     General: Abdomen is flat. Bowel sounds are normal.     Palpations: Abdomen is soft.  Musculoskeletal:        General: Normal range of motion.     Cervical back: Normal range of motion and neck supple.  Skin:    General: Skin is warm and dry.  Neurological:     General: No focal deficit present.     Mental Status: She is alert and oriented to person, place, and time.  Psychiatric:        Mood and Affect: Mood normal.        Behavior: Behavior normal.        Thought Content: Thought content normal.        Judgment: Judgment normal.     BP 123/76   Pulse 76   Temp (!) 97.3 F (36.3 C) (Temporal)   Resp 17   Wt 141 lb (64 kg)   LMP 06/13/2020   SpO2 100%   BMI 24.59 kg/m  Wt Readings from Last 3 Encounters:  07/29/20 141 lb (64 kg)  12/14/19 138 lb 12.8 oz (63 kg) (68 %, Z= 0.46)*  08/09/19 140 lb 3.2 oz (63.6 kg) (71 %, Z= 0.55)*   * Growth percentiles are based on CDC (Girls, 2-20 Years) data.     Health Maintenance Due  Topic Date Due  . Hepatitis C Screening  Never done  . COVID-19 Vaccine (1) Never done  . INFLUENZA VACCINE  07/14/2020    There are no preventive care reminders to display for this patient.  Lab Results  Component Value Date   TSH 1.450 12/28/2018   Lab Results  Component Value  Date   WBC 11.5 (H) 10/09/2018   HGB 12.1 10/09/2018   HCT 39.1 10/09/2018   MCV 87.5 10/09/2018  PLT 279 10/09/2018   Lab Results  Component Value Date   NA 146 (H) 12/28/2018   K 3.9 12/28/2018   CO2 24 12/28/2018   GLUCOSE 80 12/28/2018   BUN 7 12/28/2018   CREATININE 0.80 12/28/2018   BILITOT 0.9 10/09/2018   ALKPHOS 39 10/09/2018   AST 25 10/09/2018   ALT 19 10/09/2018   PROT 8.0 10/09/2018   ALBUMIN 4.4 10/09/2018   CALCIUM 9.7 12/28/2018   ANIONGAP 12 10/09/2018   No results found for: CHOL No results found for: HDL No results found for: LDLCALC No results found for: TRIG No results found for: CHOLHDL Lab Results  Component Value Date   HGBA1C 5.2 12/28/2018      Assessment & Plan:   Problem List Items Addressed This Visit    None    Visit Diagnoses    Pregnancy at early stage    -  Primary   Relevant Orders   Beta hCG quant (ref lab)   Ambulatory referral to Obstetrics / Gynecology   Menstrual period late       Relevant Orders   POCT urine pregnancy (Completed)   Rash and nonspecific skin eruption       Relevant Medications   triamcinolone cream (KENALOG) 0.1 %   Tobacco abuse counseling        1. Pregnancy at early stage Patient education given on morning sickness, refer to OB/GYN - Beta hCG quant (ref lab) - Ambulatory referral to Obstetrics / Gynecology  2. Menstrual period late  - POCT urine pregnancy  3. Rash and nonspecific skin eruption  - triamcinolone cream (KENALOG) 0.1 %; Apply 1 application topically 2 (two) times daily.  Dispense: 453.6 g; Refill: 0  4. Tobacco abuse counseling   I have reviewed the patient's medical history (PMH, PSH, Social History, Family History, Medications, and allergies) , and have been updated if relevant. I spent 20 minutes reviewing chart and  face to face time with patient.      Meds ordered this encounter  Medications  . triamcinolone cream (KENALOG) 0.1 %    Sig: Apply 1 application  topically 2 (two) times daily.    Dispense:  453.6 g    Refill:  0    Follow-up: Return if symptoms worsen or fail to improve.    Kasandra Knudsen Mayers, PA-C

## 2020-07-29 NOTE — Patient Instructions (Addendum)
We have started a referral for you for Ob/Gyn. I sent a refill of the Kenalog cream.  Please let us know if there is anything else we can do for you    Morning Sickness  Morning sickness is when a woman feels nauseous during pregnancy. This nauseous feeling may or may not come with vomiting. It often occurs in the morning, but it can be a problem at any time of day. Morning sickness is most common during the first trimester. In some cases, it may continue throughout pregnancy. Although morning sickness is unpleasant, it is usually harmless unless the woman develops severe and continual vomiting (hyperemesis gravidarum), a condition that requires more intense treatment. What are the causes? The exact cause of this condition is not known, but it seems to be related to normal hormonal changes that occur in pregnancy. What increases the risk? You are more likely to develop this condition if:  You experienced nausea or vomiting before your pregnancy.  You had morning sickness during a previous pregnancy.  You are pregnant with more than one baby, such as twins. What are the signs or symptoms? Symptoms of this condition include:  Nausea.  Vomiting. How is this diagnosed? This condition is usually diagnosed based on your signs and symptoms. How is this treated? In many cases, treatment is not needed for this condition. Making some changes to what you eat may help to control symptoms. Your health care provider may also prescribe or recommend:  Vitamin B6 supplements.  Anti-nausea medicines.  Ginger. Follow these instructions at home: Medicines  Take over-the-counter and prescription medicines only as told by your health care provider. Do not use any prescription, over-the-counter, or herbal medicines for morning sickness without first talking with your health care provider.  Taking multivitamins before getting pregnant can prevent or decrease the severity of morning sickness in most  women. Eating and drinking  Eat a piece of dry toast or crackers before getting out of bed in the morning.  Eat 5 or 6 small meals a day.  Eat dry and bland foods, such as rice or a baked potato. Foods that are high in carbohydrates are often helpful.  Avoid greasy, fatty, and spicy foods.  Have someone cook for you if the smell of any food causes nausea and vomiting.  If you feel nauseous after taking prenatal vitamins, take the vitamins at night or with a snack.  Snack on protein foods between meals if you are hungry. Nuts, yogurt, and cheese are good options.  Drink fluids throughout the day.  Try ginger ale made with real ginger, ginger tea made from fresh grated ginger, or ginger candies. General instructions  Do not use any products that contain nicotine or tobacco, such as cigarettes and e-cigarettes. If you need help quitting, ask your health care provider.  Get an air purifier to keep the air in your house free of odors.  Get plenty of fresh air.  Try to avoid odors that trigger your nausea.  Consider trying these methods to help relieve symptoms: ? Wearing an acupressure wristband. These wristbands are often worn for seasickness. ? Acupuncture. Contact a health care provider if:  Your home remedies are not working and you need medicine.  You feel dizzy or light-headed.  You are losing weight. Get help right away if:  You have persistent and uncontrolled nausea and vomiting.  You faint.  You have severe pain in your abdomen. Summary  Morning sickness is when a woman feels nauseous during  pregnancy. This nauseous feeling may or may not come with vomiting.  Morning sickness is most common during the first trimester.  It often occurs in the morning, but it can be a problem at any time of day.  In many cases, treatment is not needed for this condition. Making some changes to what you eat may help to control symptoms. This information is not intended to  replace advice given to you by your health care provider. Make sure you discuss any questions you have with your health care provider. Document Revised: 11/12/2017 Document Reviewed: 01/02/2017 Elsevier Patient Education  2020 [employer].

## 2020-07-30 LAB — BETA HCG QUANT (REF LAB): hCG Quant: 4359 m[IU]/mL

## 2020-08-03 ENCOUNTER — Other Ambulatory Visit: Payer: Self-pay

## 2020-08-03 ENCOUNTER — Inpatient Hospital Stay (HOSPITAL_COMMUNITY): Payer: Medicaid Other

## 2020-08-03 ENCOUNTER — Inpatient Hospital Stay (HOSPITAL_COMMUNITY)
Admission: AD | Admit: 2020-08-03 | Discharge: 2020-08-03 | Disposition: A | Discharge status: Home / Self Care | Payer: Medicaid Other | Attending: Obstetrics and Gynecology | Admitting: Obstetrics and Gynecology

## 2020-08-03 ENCOUNTER — Encounter (HOSPITAL_COMMUNITY): Payer: Self-pay | Admitting: Obstetrics and Gynecology

## 2020-08-03 DIAGNOSIS — R21 Rash and other nonspecific skin eruption: Secondary | ICD-10-CM

## 2020-08-03 DIAGNOSIS — Z3A01 Less than 8 weeks gestation of pregnancy: Secondary | ICD-10-CM | POA: Insufficient documentation

## 2020-08-03 DIAGNOSIS — O468X1 Other antepartum hemorrhage, first trimester: Secondary | ICD-10-CM

## 2020-08-03 DIAGNOSIS — O208 Other hemorrhage in early pregnancy: Secondary | ICD-10-CM | POA: Insufficient documentation

## 2020-08-03 DIAGNOSIS — F17200 Nicotine dependence, unspecified, uncomplicated: Secondary | ICD-10-CM | POA: Insufficient documentation

## 2020-08-03 DIAGNOSIS — O99331 Smoking (tobacco) complicating pregnancy, first trimester: Secondary | ICD-10-CM | POA: Insufficient documentation

## 2020-08-03 DIAGNOSIS — Z3491 Encounter for supervision of normal pregnancy, unspecified, first trimester: Secondary | ICD-10-CM

## 2020-08-03 DIAGNOSIS — O26891 Other specified pregnancy related conditions, first trimester: Secondary | ICD-10-CM | POA: Diagnosis not present

## 2020-08-03 DIAGNOSIS — R1030 Lower abdominal pain, unspecified: Secondary | ICD-10-CM | POA: Diagnosis not present

## 2020-08-03 LAB — WET PREP, GENITAL
Clue Cells Wet Prep HPF POC: NONE SEEN
Sperm: NONE SEEN
Trich, Wet Prep: NONE SEEN
Yeast Wet Prep HPF POC: NONE SEEN

## 2020-08-03 LAB — URINALYSIS, ROUTINE W REFLEX MICROSCOPIC
Bilirubin Urine: NEGATIVE
Glucose, UA: NEGATIVE mg/dL
Hgb urine dipstick: NEGATIVE
Ketones, ur: 5 mg/dL — AB
Leukocytes,Ua: NEGATIVE
Nitrite: NEGATIVE
Protein, ur: 30 mg/dL — AB
Specific Gravity, Urine: 1.033 — ABNORMAL HIGH (ref 1.005–1.030)
pH: 6 (ref 5.0–8.0)

## 2020-08-03 NOTE — Discharge Instructions (Signed)
Return to care   If you have heavier bleeding that soaks through more that 2 pads per hour for an hour or more  If you bleed so much that you feel like you might pass out or you do pass out  If you have significant abdominal pain that is not improved with Tylenol       Subchorionic Hematoma  A subchorionic hematoma is a gathering of blood between the outer wall of the embryo (chorion) and the inner wall of the womb (uterus). This condition can cause vaginal bleeding. If they cause little or no vaginal bleeding, early small hematomas usually shrink on their own and do not affect your baby or pregnancy. When bleeding starts later in pregnancy, or if the hematoma is larger or occurs in older pregnant women, the condition may be more serious. Larger hematomas may get bigger, which increases the chances of miscarriage. This condition also increases the risk of:  Premature separation of the placenta from the uterus.  Premature (preterm) labor.  Stillbirth. What are the causes? The exact cause of this condition is not known. It occurs when blood is trapped between the placenta and the uterine wall because the placenta has separated from the original site of implantation. What increases the risk? You are more likely to develop this condition if:  You were treated with fertility medicines.  You conceived through in vitro fertilization (IVF). What are the signs or symptoms? Symptoms of this condition include:  Vaginal spotting or bleeding.  Contractions of the uterus. These cause abdominal pain. Sometimes you may have no symptoms and the bleeding may only be seen when ultrasound images are taken (transvaginal ultrasound). How is this diagnosed? This condition is diagnosed based on a physical exam. This includes a pelvic exam. You may also have other tests, including:  Blood tests.  Urine tests.  Ultrasound of the abdomen. How is this treated? Treatment for this condition can vary.  Treatment may include:  Watchful waiting. You will be monitored closely for any changes in bleeding. During this stage: ? The hematoma may be reabsorbed by the body. ? The hematoma may separate the fluid-filled space containing the embryo (gestational sac) from the wall of the womb (endometrium).  Medicines.  Activity restriction. This may be needed until the bleeding stops. Follow these instructions at home:  Stay on bed rest if told to do so by your health care provider.  Do not lift anything that is heavier than 10 lbs. (4.5 kg) or as told by your health care provider.  Do not use any products that contain nicotine or tobacco, such as cigarettes and e-cigarettes. If you need help quitting, ask your health care provider.  Track and write down the number of pads you use each day and how soaked (saturated) they are.  Do not use tampons.  Keep all follow-up visits as told by your health care provider. This is important. Your health care provider may ask you to have follow-up blood tests or ultrasound tests or both. Contact a health care provider if:  You have any vaginal bleeding.  You have a fever. Get help right away if:  You have severe cramps in your stomach, back, abdomen, or pelvis.  You pass large clots or tissue. Save any tissue for your health care provider to look at.  You have more vaginal bleeding, and you faint or become lightheaded or weak. Summary  A subchorionic hematoma is a gathering of blood between the outer wall of the placenta  and the uterus.  This condition can cause vaginal bleeding.  Sometimes you may have no symptoms and the bleeding may only be seen when ultrasound images are taken.  Treatment may include watchful waiting, medicines, or activity restriction. This information is not intended to replace advice given to you by your health care provider. Make sure you discuss any questions you have with your health care provider. Document Revised:  11/12/2017 Document Reviewed: 01/26/2017 Elsevier Patient Education  2020 [employer].

## 2020-08-03 NOTE — MAU Note (Signed)
Terri Potts is a 19 y.o. at [redacted]w[redacted]d here in MAU reporting: states she does not have an appetite and this has been going on for 3 days. Also reporting lower abdominal pain since last week. No bleeding or discharge.  LMP: 06/13/20  Onset of complaint: ongoing  Pain score: 5/10  Vitals:   08/03/20 1306  BP: 121/66  Pulse: 78  Resp: 16  Temp: 98.2 F (36.8 C)  SpO2: 100%     Lab orders placed from triage: UA

## 2020-08-03 NOTE — MAU Provider Note (Signed)
Chief Complaint: Abdominal Pain   First Provider Initiated Contact with Patient 08/03/20 1353     SUBJECTIVE HPI: Terri Potts is a 19 y.o. G1P0000 at [redacted]w[redacted]d who presents to [facility] Admissions reporting abdominal pain. Symptoms started last week. Reports intermittent cramping throughout her lower abdomen. Has also had decreased appetite. No longer having nausea & denies vomiting. Denies fever/chills, diarrhea, constipation, dysuria, vaginal discharge, or vaginal bleeding. Had negative STD testing last month; would like to be retested today.   Location: abdomen Quality: cramping Severity: 5/10 on pain scale Duration: 1 week Timing: intermittent Modifying factors: none Associated signs and symptoms: decreased appetite  Past Medical History:  Diagnosis Date  . History of anemia   . Seasonal allergies    OB History  Gravida Para Term Preterm AB Living  1 0 0 0 0 0  SAB TAB Ectopic Multiple Live Births  0 0 0 0      # Outcome Date GA Lbr Len/2nd Weight Sex Delivery Anes PTL Lv  1 Current            Past Surgical History:  Procedure Laterality Date  . NO PAST SURGERIES     Social History   Socioeconomic History  . Marital status: Single    Spouse name: Not on file  . Number of children: Not on file  . Years of education: Not on file  . Highest education level: Not on file  Occupational History  . Not on file  Tobacco Use  . Smoking status: Current Every Day Smoker  . Smokeless tobacco: Never Used  Substance and Sexual Activity  . Alcohol use: Yes    Alcohol/week: 0.0 standard drinks  . Drug use: Yes    Types: Marijuana  . Sexual activity: Yes    Birth control/protection: None  Other Topics Concern  . Not on file  Social History Narrative  . Not on file   Social Determinants of Health   Financial Resource Strain:   . Difficulty of Paying Living Expenses: Not on file  Food Insecurity:   . Worried About Programme researcher, broadcasting/film/video in the Last Year: Not on file  . Ran  Out of Food in the Last Year: Not on file  Transportation Needs:   . Lack of Transportation (Medical): Not on file  . Lack of Transportation (Non-Medical): Not on file  Physical Activity:   . Days of Exercise per Week: Not on file  . Minutes of Exercise per Session: Not on file  Stress:   . Feeling of Stress : Not on file  Social Connections:   . Frequency of Communication with Friends and Family: Not on file  . Frequency of Social Gatherings with Friends and Family: Not on file  . Attends Religious Services: Not on file  . Active Member of Clubs or Organizations: Not on file  . Attends Banker Meetings: Not on file  . Marital Status: Not on file  Intimate Partner Violence:   . Fear of Current or Ex-Partner: Not on file  . Emotionally Abused: Not on file  . Physically Abused: Not on file  . Sexually Abused: Not on file   Family History  Problem Relation Age of Onset  . Drug abuse Maternal Grandmother   . Diabetes Maternal Grandmother   . Hypertension Maternal Grandmother   . Hypertension Paternal Grandmother   . Heart murmur Maternal Aunt   . Bronchitis Mother   . Stroke Neg Hx   . Cancer Neg Hx  No current facility-administered medications on file prior to encounter.   Current Outpatient Medications on File Prior to Encounter  Medication Sig Dispense Refill  . triamcinolone cream (KENALOG) 0.1 % Apply 1 application topically 2 (two) times daily. 453.6 g 0  . [DISCONTINUED] cetirizine (ZYRTEC) 10 MG tablet Take 1 tablet (10 mg total) by mouth daily. 30 tablet 0  . [DISCONTINUED] fluticasone (FLONASE) 50 MCG/ACT nasal spray Place 1 spray into both nostrils daily. 16 g 2  . [DISCONTINUED] Norgestimate-Ethinyl Estradiol Triphasic (ORTHO TRI-CYCLEN, 28,) 0.18/0.215/0.25 MG-35 MCG tablet Take 1 tablet by mouth daily. 1 Package 11   Allergies  Allergen Reactions  . Bee Venom Swelling    At site of bite    I have reviewed patient's Past Medical Hx, Surgical Hx,  Family Hx, Social Hx, medications and allergies.   Review of Systems  Constitutional: Positive for appetite change (decreased). Negative for chills and fever.  Gastrointestinal: Positive for abdominal pain. Negative for constipation, diarrhea, nausea and vomiting.  Genitourinary: Negative.     OBJECTIVE Patient Vitals for the past 24 hrs:  BP Temp Temp src Pulse Resp SpO2 Height Weight  08/03/20 1550 125/72 -- -- 78 16 100 % -- --  08/03/20 1306 121/66 98.2 F (36.8 C) Oral 78 16 100 % -- --  08/03/20 1303 -- -- -- -- -- -- 5' 4.5" (1.638 m) 62.7 kg   Constitutional: Well-developed, well-nourished female in no acute distress.  Cardiovascular: normal rate & rhythm, no murmur Respiratory: normal rate and effort. Lung sounds clear throughout GI:TTP suprapubic area. Abdomen soft. No guarding or rebound.  MS: Extremities nontender, no edema, normal ROM Neurologic: Alert and oriented x 4.    LAB RESULTS Results for orders placed or performed during the hospital encounter of 08/03/20 (from the past 24 hour(s))  Urinalysis, Routine w reflex microscopic Urine, Clean Catch     Status: Abnormal   Collection Time: 08/03/20  1:31 PM  Result Value Ref Range   Color, Urine AMBER (A) YELLOW   APPearance HAZY (A) CLEAR   Specific Gravity, Urine 1.033 (H) 1.005 - 1.030   pH 6.0 5.0 - 8.0   Glucose, UA NEGATIVE NEGATIVE mg/dL   Hgb urine dipstick NEGATIVE NEGATIVE   Bilirubin Urine NEGATIVE NEGATIVE   Ketones, ur 5 (A) NEGATIVE mg/dL   Protein, ur 30 (A) NEGATIVE mg/dL   Nitrite NEGATIVE NEGATIVE   Leukocytes,Ua NEGATIVE NEGATIVE   RBC / HPF 0-5 0 - 5 RBC/hpf   WBC, UA 0-5 0 - 5 WBC/hpf   Bacteria, UA RARE (A) NONE SEEN   Squamous Epithelial / LPF 0-5 0 - 5   Mucus PRESENT   Wet prep, genital     Status: Abnormal   Collection Time: 08/03/20  2:14 PM  Result Value Ref Range   Yeast Wet Prep HPF POC NONE SEEN NONE SEEN   Trich, Wet Prep NONE SEEN NONE SEEN   Clue Cells Wet Prep HPF POC  NONE SEEN NONE SEEN   WBC, Wet Prep HPF POC MODERATE (A) NONE SEEN   Sperm NONE SEEN     IMAGING US OB Comp Less 14 Wks  Result Date: 08/03/2020 CLINICAL DATA:  Abdominal pain EXAM: OBSTETRIC <14 WK ULTRASOUND TECHNIQUE: Transabdominal ultrasound was performed for evaluation of the gestation as well as the maternal uterus and adnexal regions. COMPARISON:  None. FINDINGS: Intrauterine gestational sac: Single Yolk sac:  Visualized. Embryo:  Visualized. Cardiac Activity: Visualized. Heart Rate: 112 bpm CRL:   4.0 mm  6 w 1 d                  [place] EDC: 03/28/2020 Subchorionic hemorrhage: Moderate subchorionic hemorrhage is seen along the posterior aspect of the gestational sac. Hemorrhage measures approximately 3.3 cm in greatest length, and approximately 0.9 cm in greatest with. The subchorionic hemorrhage extends along 50% of the circumference of the gestational sac. Maternal uterus/adnexae: Left ovary measures 3.6 x 2.1 x 2.0 cm and the right ovary measures 2.8 x 1.2 x 1.2 cm. No adnexal masses or free fluid. IMPRESSION: 1. Single live intrauterine pregnancy as above estimated age 37 weeks and 1 day. 2. Moderate subchorionic hemorrhage. Electronically Signed   By: Sharlet Salina M.D.   On: 08/03/2020 15:42    MAU COURSE Orders Placed This Encounter  Procedures  . Wet prep, genital  . US OB Comp Less 14 Wks  . Urinalysis, Routine w reflex microscopic Urine, Clean Catch  . Discharge patient   No orders of the defined types were placed in this encounter.   MDM Patient previously had HCG that was 4359. Will get ultrasound today.   Ultrasound shows live IUP measuring [redacted]w[redacted]d (EDD updated) & subchorionic hemorrhage.   Wet prep & u/a negative  ASSESSMENT 1. Subchorionic hematoma in first trimester, single or unspecified fetus   2. Abdominal pain during pregnancy in first trimester   3. Normal IUP (intrauterine pregnancy) on prenatal ultrasound, first trimester   4. [redacted] weeks gestation of pregnancy      PLAN Discharge home in stable condition. GC/CT pending Start prenatal care Discussed reasons to return to MAU  Discussed [facility] & given written information about it  Allergies as of 08/03/2020      Reactions   Bee Venom Swelling   At site of bite      Medication List    TAKE these medications   triamcinolone cream 0.1 % Commonly known as: KENALOG Apply 1 application topically 2 (two) times daily.        Judeth Horn, NP 08/03/2020  4:01 PM

## 2020-08-05 LAB — GC/CHLAMYDIA PROBE AMP (~~LOC~~) NOT AT ARMC
Chlamydia: NEGATIVE
Comment: NEGATIVE
Comment: NORMAL
Neisseria Gonorrhea: NEGATIVE

## 2020-08-07 LAB — OB RESULTS CONSOLE GC/CHLAMYDIA: Gonorrhea: NEGATIVE

## 2020-08-17 ENCOUNTER — Encounter (HOSPITAL_COMMUNITY): Payer: Self-pay | Admitting: Gynecology

## 2020-08-17 ENCOUNTER — Other Ambulatory Visit: Payer: Self-pay

## 2020-08-17 ENCOUNTER — Ambulatory Visit (HOSPITAL_COMMUNITY)
Admission: EM | Admit: 2020-08-17 | Discharge: 2020-08-17 | Disposition: A | Discharge status: Home / Self Care | Payer: Medicaid Other | Attending: Emergency Medicine | Admitting: Emergency Medicine

## 2020-08-17 DIAGNOSIS — R21 Rash and other nonspecific skin eruption: Secondary | ICD-10-CM

## 2020-08-17 DIAGNOSIS — L03116 Cellulitis of left lower limb: Secondary | ICD-10-CM

## 2020-08-17 MED ORDER — TRIAMCINOLONE ACETONIDE 0.1 % EX CREA: 1.0000 "application " | TOPICAL_CREAM | Freq: Two times a day (BID) | CUTANEOUS | 0 refills | Status: DC

## 2020-08-17 MED ORDER — CEPHALEXIN 500 MG PO CAPS: 500.0000 mg | ORAL_CAPSULE | Freq: Three times a day (TID) | ORAL | 0 refills | Status: AC

## 2020-08-17 MED ORDER — CETIRIZINE HCL 10 MG PO TABS: 10.0000 mg | ORAL_TABLET | Freq: Every day | ORAL | 0 refills | Status: DC

## 2020-08-17 NOTE — ED Triage Notes (Signed)
Patient c/o insect bite on her left leg x last night.

## 2020-08-17 NOTE — ED Provider Notes (Signed)
[facility]    CSN: [id number] Arrival date & time: 08/17/20  1034      History   Chief Complaint Chief Complaint  Patient presents with   Insect Bite    HPI Terri Potts is a 19 y.o. female.   [name] presents with complaints of pain and swelling to left anterior lower leg. Woke last night and noted the area, suspected a bug bite. This morning has significantly worsened and is very painful. No drainage. Minimal itching. Denies any previous similar. No witness insect bite and not other insect bites. No known MRSA history. She is [redacted] weeks pregnant.    ROS per HPI, negative if not otherwise mentioned.      Past Medical History:  Diagnosis Date   History of anemia    Seasonal allergies     Patient Active Problem List   Diagnosis Date Noted   Chronic vomiting 02/13/2019   Chronic nausea 02/13/2019   Loss of appetite 02/13/2019   Gastroesophageal reflux disease with esophagitis 02/13/2019   Unintended weight loss 02/13/2019    Past Surgical History:  Procedure Laterality Date   NO PAST SURGERIES      OB History    Gravida  1   Para  0   Term  0   Preterm  0   AB  0   Living  0     SAB  0   TAB  0   Ectopic  0   Multiple  0   Live Births               Home Medications    Prior to Admission medications   Medication Sig Start Date End Date Taking? Authorizing Provider  cephALEXin (KEFLEX) 500 MG capsule Take 1 capsule (500 mg total) by mouth 3 (three) times daily for 7 days. 08/17/20 08/24/20  Georgetta Haber, NP  cetirizine (ZYRTEC) 10 MG tablet Take 1 tablet (10 mg total) by mouth daily. 08/17/20   Linus Mako B, NP  triamcinolone cream (KENALOG) 0.1 % Apply 1 application topically 2 (two) times daily. 08/17/20   Georgetta Haber, NP  fluticasone (FLONASE) 50 MCG/ACT nasal spray Place 1 spray into both nostrils daily. 09/01/19 12/14/19  Janace Aris, NP  Norgestimate-Ethinyl Estradiol Triphasic (ORTHO TRI-CYCLEN, 28,)  0.18/0.215/0.25 MG-35 MCG tablet Take 1 tablet by mouth daily. 06/07/19 12/14/19  Bing Neighbors, FNP    Family History Family History  Problem Relation Age of Onset   Drug abuse Maternal Grandmother    Diabetes Maternal Grandmother    Hypertension Maternal Grandmother    Hypertension Paternal Grandmother    Heart murmur Maternal Aunt    Bronchitis Mother    Stroke Neg Hx    Cancer Neg Hx     Social History Social History   Tobacco Use   Smoking status: Current Every Day Smoker   Smokeless tobacco: Never Used  Substance Use Topics   Alcohol use: Yes    Alcohol/week: 0.0 standard drinks   Drug use: Yes    Types: Marijuana     Allergies   Bee venom   Review of Systems Review of Systems   Physical Exam Triage Vital Signs ED Triage Vitals  Enc Vitals Group     BP 08/17/20 1253 120/75     Pulse Rate 08/17/20 1253 86     Resp 08/17/20 1253 16     Temp 08/17/20 1253 98.2 F (36.8 C)     Temp  Source 08/17/20 1253 Oral     SpO2 08/17/20 1253 100 %     Weight 08/17/20 1252 138 lb (62.6 kg)     Height 08/17/20 1252 5\' 4"  (1.626 m)     Head Circumference --      Peak Flow --      Pain Score 08/17/20 1252 9     Pain Loc --      Pain Edu? --      Excl. in GC? --    No data found.  Updated Vital Signs BP 120/75 (BP Location: Left Arm)    Pulse 86    Temp 98.2 F (36.8 C) (Oral)    Resp 16    Ht 5\' 4"  (1.626 m)    Wt 138 lb (62.6 kg)    LMP 06/13/2020    SpO2 100%    BMI 23.69 kg/m   Visual Acuity Right Eye Distance:   Left Eye Distance:   Bilateral Distance:    Right Eye Near:   Left Eye Near:    Bilateral Near:     Physical Exam Constitutional:      General: She is not in acute distress.    Appearance: She is well-developed.  Cardiovascular:     Rate and Rhythm: Normal rate.  Pulmonary:     Effort: Pulmonary effort is normal.  Skin:    General: Skin is warm and dry.          Comments: Circular region of approximately 4 cm in  diameter which is red raised and tender with small white pustule at center; no fluctuance  Neurological:     Mental Status: She is alert and oriented to person, place, and time.      UC Treatments / Results  Labs (all labs ordered are listed, but only abnormal results are displayed) Labs Reviewed - No data to display  EKG   Radiology No results found.  Procedures Procedures (including critical care time)  Medications Ordered in UC Medications - No data to display  Initial Impression / Assessment and Plan / UC Course  I have reviewed the triage vital signs and the nursing notes.  Pertinent labs & imaging results that were available during my care of the patient were reviewed by me and considered in my medical decision making (see chart for details).     Concerning for cellulitis with amount of pain and what appears to be pinpoint pustule at center. Insect bite and local reaction also considered, encouraged topical kenalog prn as well as zyrtec. Return precautions provided. Patient verbalized understanding and agreeable to plan.   Final Clinical Impressions(s) / UC Diagnoses   Final diagnoses:  Cellulitis of left lower extremity     Discharge Instructions     Topical cream as needed for itching.  Daily zyrtec.  Complete course of antibiotics.  If symptoms worsen or do not improve in the next week to return to be seen or to follow up with your PCP.     ED Prescriptions    Medication Sig Dispense Auth. Provider   triamcinolone cream (KENALOG) 0.1 % Apply 1 application topically 2 (two) times daily. 453.6 g B, NP   cephALEXin (KEFLEX) 500 MG capsule Take 1 capsule (500 mg total) by mouth 3 (three) times daily for 7 days. 21 capsule 08/14/2020 B, NP   cetirizine (ZYRTEC) 10 MG tablet Take 1 tablet (10 mg total) by mouth daily. 30 tablet Linus Mako, NP  PDMP not reviewed this encounter.   Georgetta Haber, NP 08/17/20 1310

## 2020-08-17 NOTE — Discharge Instructions (Signed)
Topical cream as needed for itching.  Daily zyrtec.  Complete course of antibiotics.  If symptoms worsen or do not improve in the next week to return to be seen or to follow up with your PCP.

## 2020-09-02 ENCOUNTER — Ambulatory Visit (INDEPENDENT_AMBULATORY_CARE_PROVIDER_SITE_OTHER): Payer: Medicaid Other | Admitting: *Deleted

## 2020-09-02 DIAGNOSIS — Z3400 Encounter for supervision of normal first pregnancy, unspecified trimester: Secondary | ICD-10-CM

## 2020-09-02 DIAGNOSIS — O219 Vomiting of pregnancy, unspecified: Secondary | ICD-10-CM

## 2020-09-02 HISTORY — DX: Encounter for supervision of normal first pregnancy, unspecified trimester: Z34.00

## 2020-09-02 MED ORDER — DICLEGIS 10-10 MG PO TBEC: 2.0000 | DELAYED_RELEASE_TABLET | Freq: Every evening | ORAL | 2 refills | Status: DC | PRN

## 2020-09-02 MED ORDER — PRENATAL PLUS/IRON 27-1 MG PO TABS: 1.0000 | ORAL_TABLET | Freq: Every day | ORAL | 12 refills | Status: DC

## 2020-09-02 MED ORDER — GOJJI WEIGHT SCALE MISC: 1.0000 | Freq: Every day | 0 refills | Status: DC | PRN

## 2020-09-02 MED ORDER — BLOOD PRESSURE MONITOR AUTOMAT DEVI: 1.0000 | Freq: Every day | 0 refills | Status: DC

## 2020-09-02 NOTE — Progress Notes (Signed)
  Virtual Visit via Telephone Note  I connected with Terri Potts on 09/02/20 at  9:10 AM EDT by telephone and verified that I am speaking with the correct person using two identifiers.  Location: [facility] Patient: Terri Potts MRN: [id number] Provider: Clovis Pu, RN    I discussed the limitations, risks, security and privacy concerns of performing an evaluation and management service by telephone and the availability of in person appointments. I also discussed with the patient that there may be a patient responsible charge related to this service. The patient expressed understanding and agreed to proceed.   History of Present Illness: PRENATAL INTAKE SUMMARY  Terri Potts presents today New OB Nurse Interview.  OB History    Gravida  1   Para  0   Term  0   Preterm  0   AB  0   Living  0     SAB  0   TAB  0   Ectopic  0   Multiple  0   Live Births             I have reviewed the patient's medical, obstetrical, social, and family histories, medications, and available lab results.  SUBJECTIVE She complains of nausea without vomiting and loss of appetite. Patient expressed concern for housing. Information will be given at next visit.    Observations/Objective: Initial nurse interview for history/labs (New OB).  PHQ-9 score: 11, offered appointment with Behavioral Health Consultant. Will let nurse know if counseling is accepted at next prenatal visit.  EDD: 03/28/20 by early ultrasound GA: [redacted]w[redacted]d G1P0 FHT: non face to face interview  GENERAL APPEARANCE: non face to face interview  Assessment and Plan: Normal pregnancy Prenatal care-[facility] Labs to be completed at next visit with Steward Drone, CNM [facility] for Diclegis 100 mg [facility] Rx for blood pressure and weight scale sent to [facility]  Follow Up Instructions:   I discussed the assessment and treatment plan with the patient. The patient was provided an  opportunity to ask questions and all were answered. The patient agreed with the plan and demonstrated an understanding of the instructions.   The patient was advised to call back or seek an in-person evaluation if the symptoms worsen or if the condition fails to improve as anticipated.  I provided 15 minutes of non-face-to-face time during this encounter.   Clovis Pu, RN

## 2020-09-09 LAB — OB RESULTS CONSOLE RUBELLA ANTIBODY, IGM: Rubella: IMMUNE

## 2020-09-18 ENCOUNTER — Other Ambulatory Visit: Payer: Self-pay

## 2020-09-18 ENCOUNTER — Encounter: Payer: Self-pay | Admitting: Certified Nurse Midwife

## 2020-09-18 ENCOUNTER — Ambulatory Visit (INDEPENDENT_AMBULATORY_CARE_PROVIDER_SITE_OTHER): Payer: Medicaid Other | Admitting: Certified Nurse Midwife

## 2020-09-18 VITALS — BP 110/75 | HR 71 | Temp 98.2°F | Wt 144.0 lb

## 2020-09-18 DIAGNOSIS — F1290 Cannabis use, unspecified, uncomplicated: Secondary | ICD-10-CM | POA: Diagnosis not present

## 2020-09-18 DIAGNOSIS — Z3400 Encounter for supervision of normal first pregnancy, unspecified trimester: Secondary | ICD-10-CM

## 2020-09-18 DIAGNOSIS — Z3A12 12 weeks gestation of pregnancy: Secondary | ICD-10-CM | POA: Diagnosis not present

## 2020-09-18 NOTE — Patient Instructions (Signed)

## 2020-09-18 NOTE — Progress Notes (Signed)
History:   Terri Potts is a 19 y.o. G1P0000 at [redacted]w[redacted]d by early ultrasound being seen today for her first obstetrical visit.  Her obstetrical history is significant for history of marijuana and tobacco. Patient does intend to breast feed. Pregnancy history fully reviewed.  Patient reports no complaints.     HISTORY: OB History  Gravida Para Term Preterm AB Living  1 0 0 0 0 0  SAB TAB Ectopic Multiple Live Births  0 0 0 0 0    # Outcome Date GA Lbr Len/2nd Weight Sex Delivery Anes PTL Lv  1 Current              Past Medical History:  Diagnosis Date  . History of anemia   . Seasonal allergies    Past Surgical History:  Procedure Laterality Date  . NO PAST SURGERIES     Family History  Problem Relation Age of Onset  . Drug abuse Maternal Grandmother   . Diabetes Maternal Grandmother   . Hypertension Maternal Grandmother   . Hypertension Paternal Grandmother   . Heart murmur Maternal Aunt   . Bronchitis Mother   . Stroke Neg Hx   . Cancer Neg Hx    Social History   Tobacco Use  . Smoking status: Former Smoker    Types: Cigars  . Smokeless tobacco: Never Used  Vaping Use  . Vaping Use: Never used  Substance Use Topics  . Alcohol use: Not Currently    Alcohol/week: 0.0 standard drinks  . Drug use: Not Currently    Types: Marijuana   Allergies  Allergen Reactions  . Bee Venom Swelling    At site of bite   Current Outpatient Medications on File Prior to Visit  Medication Sig Dispense Refill  . Blood Pressure Monitoring (BLOOD PRESSURE MONITOR AUTOMAT) DEVI 1 Device by Does not apply route daily. Automatic blood pressure cuff regular size. To monitor blood pressure regularly at home. ICD-10 code: O73.90 (Patient not taking: Reported on 09/18/2020) 1 each 0  . cetirizine (ZYRTEC) 10 MG tablet Take 1 tablet (10 mg total) by mouth daily. (Patient not taking: Reported on 09/18/2020) 30 tablet 0  . DICLEGIS 10-10 MG TBEC Take 2 tablets by mouth at bedtime as needed  (take for nausea and vomiting). (Patient not taking: Reported on 09/18/2020) 60 tablet 2  . Misc. Devices (GOJJI WEIGHT SCALE) MISC 1 Device by Does not apply route daily as needed. To weight self daily as needed at home. ICD-10 code: O72.90 (Patient not taking: Reported on 09/18/2020) 1 each 0  . Prenatal Vit-Fe Fumarate-FA (PRENATAL PLUS/IRON) 27-1 MG TABS Take 1 tablet by mouth daily. (Patient not taking: Reported on 09/18/2020) 30 tablet 12  . triamcinolone cream (KENALOG) 0.1 % Apply 1 application topically 2 (two) times daily. (Patient not taking: Reported on 09/02/2020) 453.6 g 0  . [DISCONTINUED] fluticasone (FLONASE) 50 MCG/ACT nasal spray Place 1 spray into both nostrils daily. 16 g 2  . [DISCONTINUED] Norgestimate-Ethinyl Estradiol Triphasic (ORTHO TRI-CYCLEN, 28,) 0.18/0.215/0.25 MG-35 MCG tablet Take 1 tablet by mouth daily. 1 Package 11   No current facility-administered medications on file prior to visit.    Review of Systems Pertinent items noted in HPI and remainder of comprehensive ROS otherwise negative. Physical Exam:   Vitals:   09/18/20 0930  BP: 110/75  Pulse: 71  Temp: 98.2 F (36.8 C)  Weight: 144 lb (65.3 kg)   Fetal Heart Rate (bpm): 156  System: General: well-developed, well-nourished female in  no acute distress   Skin: normal coloration and turgor, no rashes   Neurologic: oriented, normal, negative, normal mood   Extremities: normal strength, tone, and muscle mass, ROM of all joints is normal   HEENT PERRLA, extraocular movement intact and sclera clear   Mouth/Teeth mucous membranes moist, pharynx normal without lesions and dental hygiene good   Neck supple and no masses   Cardiovascular: regular rate and rhythm   Respiratory:  no respiratory distress, normal breath sounds   Abdomen: soft, non-tender; bowel sounds normal; no masses,  no organomegaly    Assessment:    Pregnancy: G1P0000 Patient Active Problem List   Diagnosis Date Noted  . Supervision  of normal first pregnancy, antepartum 09/02/2020  . Chronic vomiting 02/13/2019  . Chronic nausea 02/13/2019  . Loss of appetite 02/13/2019  . Gastroesophageal reflux disease with esophagitis 02/13/2019  . Unintended weight loss 02/13/2019     Plan:    1. Supervision of normal first pregnancy, antepartum - After appointment started patient reports that she had NOB appt at [facility] on 9/27 - Patient reports that she has another appointment scheduled on 10/22 at [facility], patient reports that she plans on staying with [facility]  - Discussed with patient that she does not need two OBs, if patient wants to stays at [facility] then does not need care with [place].  - Patient verbalizes understanding, will follow up as scheduled with [facility]   2. [redacted] weeks gestation of pregnancy  3. Marijuana use - Patient reports hx of marijuana use- last use last month, encouraged cessation of use, - patient also reports use of black and milds, patient reports she stopped once she found out she was pregnant.    Terri Potts, CNM [facility] [employer]

## 2020-11-21 ENCOUNTER — Telehealth: Payer: Self-pay

## 2020-11-21 NOTE — Telephone Encounter (Signed)
NOTES ON FILE FROM DR Janey Greaser [phone], SENT REFERRAL TO SCHEDULING

## 2020-12-14 NOTE — L&D Delivery Note (Signed)
Patient was C/C/+5 and pushed for 3 minutes with epidural.    NSVD  female infant, Apgars 8,9, weight p.   The patient had a very small perineal laceration, less than first degree but repaired with 3-0 vicryl R for hemostasis. Fundus was firm. EBL was expected amount. Placenta was delivered intact. Vagina was clear.  Delayed cord clamping done for 30-60 seconds while warming baby. Baby was vigorous and doing skin to skin with mother.  Loney Laurence

## 2020-12-18 ENCOUNTER — Other Ambulatory Visit: Payer: Self-pay

## 2020-12-18 ENCOUNTER — Ambulatory Visit (INDEPENDENT_AMBULATORY_CARE_PROVIDER_SITE_OTHER): Payer: Medicaid Other | Admitting: Internal Medicine

## 2020-12-18 ENCOUNTER — Ambulatory Visit (INDEPENDENT_AMBULATORY_CARE_PROVIDER_SITE_OTHER): Payer: Medicaid Other

## 2020-12-18 ENCOUNTER — Encounter: Payer: Self-pay | Admitting: Internal Medicine

## 2020-12-18 VITALS — BP 100/60 | HR 80 | Ht 64.0 in | Wt 159.0 lb

## 2020-12-18 DIAGNOSIS — R55 Syncope and collapse: Secondary | ICD-10-CM

## 2020-12-18 NOTE — Patient Instructions (Signed)
Medication Instructions:  Your physician recommends that you continue on your current medications as directed. Please refer to the Current Medication list given to you today.  *If you need a refill on your cardiac medications before your next appointment, please call your pharmacy*   Lab Work: None Ordered If you have labs (blood work) drawn today and your tests are completely normal, you will receive your results only by: Marland Kitchen MyChart Message (if you have MyChart) OR . A paper copy in the mail If you have any lab test that is abnormal or we need to change your treatment, we will call you to review the results.   Testing/Procedures: Your physician has requested that you have an echocardiogram. Echocardiography is a painless test that uses sound waves to create images of your heart. It provides your doctor with information about the size and shape of your heart and how well your heart's chambers and valves are working. This procedure takes approximately one hour. There are no restrictions for this procedure.  ZIO XT- Long Term Monitor Instructions   Your physician has requested you wear your ZIO patch monitor__14___days.   This is a single patch monitor.  [employer] supplies one patch monitor per enrollment.  Additional stickers are not available.   Please do not apply patch if you will be having a Nuclear Stress Test, Echocardiogram, Cardiac CT, MRI, or Chest Xray during the time frame you would be wearing the monitor. The patch cannot be worn during these tests.  You cannot remove and re-apply the ZIO XT patch monitor.   Your ZIO patch monitor will be sent USPS Priority mail from [facility] directly to your home address. The monitor may also be mailed to a PO BOX if home delivery is not available.   It may take 3-5 days to receive your monitor after you have been enrolled.   Once you have received you monitor, please review enclosed instructions.  Your monitor has already been  registered assigning a specific monitor serial # to you.   Applying the monitor   Shave hair from upper left chest.   Hold abrader disc by orange tab.  Rub abrader in 40 strokes over left upper chest as indicated in your monitor instructions.   Clean area with 4 enclosed alcohol pads .  Use all pads to assure are is cleaned thoroughly.  Let dry.   Apply patch as indicated in monitor instructions.  Patch will be place under collarbone on left side of chest with arrow pointing upward.   Rub patch adhesive wings for 2 minutes.Remove white label marked "1".  Remove white label marked "2".  Rub patch adhesive wings for 2 additional minutes.   While looking in a mirror, press and release button in center of patch.  A small green light will flash 3-4 times .  This will be your only indicator the monitor has been turned on.     Do not shower for the first 24 hours.  You may shower after the first 24 hours.   Press button if you feel a symptom. You will hear a small click.  Record Date, Time and Symptom in the Patient Log Book.   When you are ready to remove patch, follow instructions on last 2 pages of Patient Log Book.  Stick patch monitor onto last page of Patient Log Book.   Place Patient Log Book in [place] box.  Use locking tab on box and tape box closed securely.  The [place] and [employer]  box has prepaid postage on it.  Please place in mailbox as soon as possible.  Your physician should have your test results approximately 7 days after the monitor has been mailed back to [facility].   Call [facility] Customer Care at [phone] if you have questions regarding your ZIO XT patch monitor.  Call them immediately if you see an orange light blinking on your monitor.   If your monitor falls off in less than 4 days contact our Monitor department at [phone].  If your monitor becomes loose or falls off after 4 days call [employer] at [phone] for suggestions on securing your monitor.      Follow-Up: At [facility], you and your health needs are our priority.  As part of our continuing mission to provide you with exceptional heart care, we have created designated Provider Care Teams.  These Care Teams include your primary Cardiologist (physician) and Advanced Practice Providers (APPs -  Physician Assistants and Nurse Practitioners) who all work together to provide you with the care you need, when you need it.  Your next appointment:   6-8 week(s)  The format for your next appointment:   In Person  Provider:   You may see Riley Lam, MD or one of the following Advanced Practice Providers on your designated Care Team:    Ronie Spies, PA-C  Jacolyn Reedy, PA-C

## 2020-12-18 NOTE — Progress Notes (Unsigned)
Cardiology Office Note:    Date:  12/18/2020   ID:  [name], DOB 06-17-2001, MRN [id number]  PCP:  Patient, No Pcp Per  [facility] Cardiologist:  No primary care provider on file.  [facility] Electrophysiologist:  None   CC: Passing out Consulted for Terri evaluation of syncope in pregnancy at Terri behest of Patient, No Pcp Per  History of Present Illness:    Terri Potts is a 20 y.o. female with a hx of syncope who presents for evaluation.  Patient notes that she is feeling been having passing out spells before pregnancy:  June Terri July.  Had an episodes in Terri first week of December 2022.  In December before she passed out she felt good.  She was cashing someone out at work, felt hot Terri then cold, then passed out.  In June Terri July also passed out at works at [employer].  Both times she was at work on her feet while standing after prolonged standing; at that time fell Terri hit her head.  Has had no chest pain, chest pressure, chest tightness, chest stinging.   No shortness of breath, DOE .  No PND or orthopnea.  No bendopnea, weight gain, leg swelling , or abdominal swelling.   Does note that her heart is racing (June, when her heart was racing).   Feels like it is occurring with standing too quickly.  Patient reports NO prior cardiac testing including  echo,  stress test,  heart catheterizations,  cardioversion,  ablations.  No history of pre-eclampsia.  No drug use.  Past Medical History:  Diagnosis Date  . Anxiety   . Eczema   . History of anemia   . Seasonal allergies   . Supervision of normal first pregnancy, antepartum 09/02/2020    Nursing Staff Provider Office Location  [facility] Dating   early [place] Language  English Anatomy US   ordered Flu Vaccine   Genetic Screen  NIPS:   AFP:     TDaP Vaccine    Hgb A1C or  GTT Early A1c-  Third trimester  COVID Vaccine    LAB RESULTS  Rhogam   Blood Type    Feeding Plan Breast Antibody   Contraception Undecided Rubella    Circumcision Yes RPR Non Reactive (07/26 1611)  Pediatrician  In  . Syncope Terri collapse     Past Surgical History:  Procedure Laterality Date  . NO PAST SURGERIES      Current Medications: Current Meds  Medication Sig  . Blood Pressure Monitoring (BLOOD PRESSURE MONITOR AUTOMAT) DEVI 1 Device by Does not apply route daily. Automatic blood pressure cuff regular size. To monitor blood pressure regularly at home. ICD-10 code: O09.90  . Misc. Devices (GOJJI WEIGHT SCALE) MISC 1 Device by Does not apply route daily as needed. To weight self daily as needed at home. ICD-10 code: O09.90  . Prenatal Vit-Fe Fumarate-FA (PRENATAL PLUS/IRON) 27-1 MG TABS Take 1 tablet by mouth daily.     Allergies:   Bee venom   Social History   Socioeconomic History  . Marital status: Single    Spouse Potts: Not on file  . Number of children: Not on file  . Years of education: Not on file  . Highest education level: High school graduate  Occupational History  . Not on file  Tobacco Use  . Smoking status: Former Smoker    Types: Cigars  . Smokeless tobacco: Never Used  Vaping Use  . Vaping  Use: Never used  Substance Terri Sexual Activity  . Alcohol use: Not Currently    Alcohol/week: 0.0 standard drinks  . Drug use: Not Currently    Types: Marijuana  . Sexual activity: Yes    Birth control/protection: None  Other Topics Concern  . Not on file  Social History Narrative  . Not on file   Social Determinants of Health   Financial Resource Strain: Medium Risk  . Difficulty of Paying Living Expenses: Somewhat hard  Food Insecurity: No Food Insecurity  . Worried About Programme researcher, broadcasting/film/video in Terri Last Year: Never true  . Ran Out of Food in Terri Last Year: Never true  Transportation Needs: Unmet Transportation Needs  . Lack of Transportation (Medical): Yes  . Lack of Transportation (Non-Medical): Yes  Physical Activity: Not on file  Stress: Not on file  Social Connections: Not on file     Family  History: Terri patient's family history includes Bronchitis in her mother; Diabetes in her maternal grandmother; Drug abuse in her maternal grandmother; Heart murmur in her maternal aunt; Hypertension in her maternal grandmother Terri paternal grandmother. There is no history of Stroke or Cancer. History of coronary artery disease notable for great grandmother. History of heart failure notable for no members. No history of cardiomyopathies including hypertrophic cardiomyopathy, left ventricular non-compaction, or arrhythmogenic right ventricular cardiomyopathy. History of arrhythmia notable for no members. Denies family history of sudden cardiac death including drowning, car accidents, or unexplained deaths in Terri family, except grandmother who died in her 34s. No history of bicuspid aortic valve or aortic aneurysm or dissection.  ROS:   Please see Terri history of present illness.    All other systems reviewed Terri are negative.  EKGs/Labs/Other Studies Reviewed:    Terri following studies were reviewed today:  EKG:   05/12/20: Sinus arrhythmia rate 74 J point elevation consider normal variant Recent Labs: No results found for requested labs within last 8760 hours.  Recent Lipid Panel No results found for: CHOL, TRIG, HDL, CHOLHDL, VLDL, LDLCALC, LDLDIRECT   Risk Assessment/Calculations:    CARPREG II - 0  Physical Exam:    VS:  BP 100/60   Pulse 80   Ht 5\' 4"  (1.626 m)   Wt 159 lb (72.1 kg)   LMP 06/13/2020   SpO2 98%   BMI 27.29 kg/m     Wt Readings from Last 3 Encounters:  12/18/20 159 lb (72.1 kg)  09/18/20 144 lb (65.3 kg)  08/17/20 138 lb (62.6 kg)    GEN:  Well nourished, well developed in no acute distress HEENT: Normal NECK: No JVD; No carotid bruits LYMPHATICS: No lymphadenopathy CARDIAC: RRR, no murmurs, rubs, gallops RESPIRATORY:  Clear to auscultation without rales, wheezing or rhonchi  ABDOMEN: Soft, non-tender, non-distended MUSCULOSKELETAL:  No edema; No  deformity  SKIN: Warm Terri dry NEUROLOGIC:  Alert Terri oriented x 3 PSYCHIATRIC:  Normal affect   ASSESSMENT:    1. Syncope, unspecified syncope type    PLAN:    In order of problems listed above:  Pregnancy with syncope - sounds like vasovagal syncope - CARPREG II Score of 0 - Low-dose aspirin (81 mg/day) prophylaxis is recommended in women at high risk of preeclampsia Terri should be initiated between 12 weeks Terri 28 weeks of gestation (optimally before 16 weeks) Terri continued daily until delivery. - will get echocardiogram - will get ziopatch   6-8 weeks follow up unless new symptoms or abnormal test results warranting change in plan  Would be reasonable for  Virtual Follow up  Would be reasonable for  APP Follow up    Medication Adjustments/Labs Terri Tests Ordered: Current medicines are reviewed at length with Terri patient today.  Concerns regarding medicines are outlined above.  Orders Placed This Encounter  Procedures  . LONG TERM MONITOR (3-14 DAYS)  . ECHOCARDIOGRAM COMPLETE   No orders of Terri defined types were placed in this encounter.   Patient Instructions  Medication Instructions:  Your physician recommends that you continue on your current medications as directed. Please refer to Terri Current Medication list given to you today.  *If you need a refill on your cardiac medications before your next appointment, please call your pharmacy*   Lab Work: None Ordered If you have labs (blood work) drawn today Terri your tests are completely normal, you will receive your results only by: Marland Kitchen MyChart Message (if you have MyChart) OR . A paper copy in Terri mail If you have any lab test that is abnormal or we need to change your treatment, we will call you to review Terri results.   Testing/Procedures: Your physician has requested that you have an echocardiogram. Echocardiography is a painless test that uses sound waves to create images of your heart. It provides your doctor  with information about Terri size Terri shape of your heart Terri how well your heart's chambers Terri valves are working. This procedure takes approximately one hour. There are no restrictions for this procedure.  ZIO XT- Long Term Monitor Instructions   Your physician has requested you wear your ZIO patch monitor__14___days.   This is a single patch monitor.  [employer] supplies one patch monitor per enrollment.  Additional stickers are not available.   Please do not apply patch if you will be having a Nuclear Stress Test, Echocardiogram, Cardiac CT, MRI, or Chest Xray during Terri time frame you would be wearing Terri monitor. Terri patch cannot be worn during these tests.  You cannot remove Terri re-apply Terri ZIO XT patch monitor.   Your ZIO patch monitor will be sent USPS Priority mail from [facility] directly to your home address. Terri monitor may also be mailed to a PO BOX if home delivery is not available.   It may take 3-5 days to receive your monitor after you have been enrolled.   Once you have received you monitor, please review enclosed instructions.  Your monitor has already been registered assigning a specific monitor serial # to you.   Applying Terri monitor   Shave hair from upper left chest.   Hold abrader disc by orange tab.  Rub abrader in 40 strokes over left upper chest as indicated in your monitor instructions.   Clean area with 4 enclosed alcohol pads .  Use all pads to assure are is cleaned thoroughly.  Let dry.   Apply patch as indicated in monitor instructions.  Patch will be place under collarbone on left side of chest with arrow pointing upward.   Rub patch adhesive wings for 2 minutes.Remove white label marked "1".  Remove white label marked "2".  Rub patch adhesive wings for 2 additional minutes.   While looking in a mirror, press Terri release button in center of patch.  A small green light will flash 3-4 times .  This will be your only indicator Terri monitor has been  turned on.     Do not shower for Terri first 24 hours.  You may shower after Terri first 24 hours.  Press button if you feel a symptom. You will hear a small click.  Record Date, Time Terri Symptom in Terri Patient Log Book.   When you are ready to remove patch, follow instructions on last 2 pages of Patient Log Book.  Stick patch monitor onto last page of Patient Log Book.   Place Patient Log Book in [place] box.  Use locking tab on box Terri tape box closed securely.  Terri Orange Terri [employer] has [employer] on it.  Please place in mailbox as soon as possible.  Your physician should have your test results approximately 7 days after Terri monitor has been mailed back to [facility].   Call [place] at [phone] if you have questions regarding your ZIO XT patch monitor.  Call them immediately if you see an orange light blinking on your monitor.   If your monitor falls off in less than 4 days contact our Monitor department at [phone].  If your monitor becomes loose or falls off after 4 days call [employer] at [phone] for suggestions on securing your monitor.     Follow-Up: At [facility], you Terri your health needs are our priority.  As part of our continuing mission to provide you with exceptional heart care, we have created designated Provider Care Teams.  These Care Teams include your primary Cardiologist (physician) Terri Advanced Practice Providers (APPs -  Physician Assistants Terri Nurse Practitioners) who all work together to provide you with Terri care you need, when you need it.  Your next appointment:   6-8 week(s)  Terri format for your next appointment:   In Person  Provider:   You may see Rudean Haskell, MD or one of Terri following Advanced Practice Providers on your designated Care Team:    Melina Copa, PA-C  Ermalinda Barrios, PA-C        Signed, Werner Lean, MD  12/18/2020 4:58 PM    [place]

## 2020-12-27 DIAGNOSIS — R55 Syncope and collapse: Secondary | ICD-10-CM

## 2021-01-07 ENCOUNTER — Other Ambulatory Visit: Payer: Self-pay

## 2021-01-07 ENCOUNTER — Ambulatory Visit (HOSPITAL_COMMUNITY): Payer: Medicaid Other | Attending: Internal Medicine

## 2021-01-07 DIAGNOSIS — R55 Syncope and collapse: Secondary | ICD-10-CM | POA: Insufficient documentation

## 2021-01-07 LAB — ECHOCARDIOGRAM COMPLETE
Area-P 1/2: 2.17 cm2
S' Lateral: 2.9 cm

## 2021-01-08 ENCOUNTER — Telehealth: Payer: Self-pay

## 2021-01-08 NOTE — Telephone Encounter (Signed)
The patient has been notified of the result and verbalized understanding.  All questions (if any) were answered. Leanord Hawking, RN 01/08/2021 8:41 AM

## 2021-01-08 NOTE — Telephone Encounter (Signed)
-----   Message from Christell Constant, MD sent at 01/08/2021  8:29 AM EST ----- Results: Normal BiV function Plan: Continue current plan  Christell Constant, MD

## 2021-02-06 ENCOUNTER — Ambulatory Visit: Payer: Medicaid Other | Admitting: Internal Medicine

## 2021-02-06 NOTE — Progress Notes (Deleted)
Cardiology Office Note:    Date:  02/06/2021   ID:  Terri Potts, DOB 13-Dec-2001, MRN [id number]  PCP:  Patient, No Pcp Per  [facility] Cardiologist:  Riley Lam MD [facility] Electrophysiologist:  None   CC: Follow up Syncope  History of Present Illness:    Terri Potts is a 20 y.o. female with a hx of syncope who presents for evaluation 12/18/20. In interim of this visit, patient had normal echo and ziopatch- had planned to see Cardio-obstetrics originally, but seen 02/06/21  Patient notes that he is doing ***.  Since day prior/last visit notes *** changes.  Relevant interval testing or therapy include ***.  There are no*** interval hospital/ED visit.    No chest pain or pressure ***.  No SOB/DOE*** and no PND/Orthopnea***.  No weight gain or leg swelling***.  No palpitations or syncope ***.  Ambulatory blood pressure ***.   Past Medical History:  Diagnosis Date  . Anxiety   . Eczema   . History of anemia   . Seasonal allergies   . Supervision of normal first pregnancy, antepartum 09/02/2020    Nursing Staff Provider Office Location  [facility] Dating   early [place] Language  English Anatomy US   ordered Flu Vaccine   Genetic Screen  NIPS:   AFP:     TDaP Vaccine    Hgb A1C or  GTT Early A1c-  Third trimester  COVID Vaccine    LAB RESULTS  Rhogam   Blood Type    Feeding Plan Breast Antibody   Contraception Undecided Rubella   Circumcision Yes RPR Non Reactive (07/26 1611)  Pediatrician  In  . Syncope and collapse     Past Surgical History:  Procedure Laterality Date  . NO PAST SURGERIES      Current Medications: No outpatient medications have been marked as taking for the 02/06/21 encounter (Appointment) with Christell Constant, MD.     Allergies:   Bee venom   Social History   Socioeconomic History  . Marital status: Single    Spouse name: Not on file  . Number of children: Not on file  . Years of education: Not on file  . Highest education level:  High school graduate  Occupational History  . Not on file  Tobacco Use  . Smoking status: Former Smoker    Types: Cigars  . Smokeless tobacco: Never Used  Vaping Use  . Vaping Use: Never used  Substance and Sexual Activity  . Alcohol use: Not Currently    Alcohol/week: 0.0 standard drinks  . Drug use: Not Currently    Types: Marijuana  . Sexual activity: Yes    Birth control/protection: None  Other Topics Concern  . Not on file  Social History Narrative  . Not on file   Social Determinants of Health   Financial Resource Strain: Medium Risk  . Difficulty of Paying Living Expenses: Somewhat hard  Food Insecurity: No Food Insecurity  . Worried About Programme researcher, broadcasting/film/video in the Last Year: Never true  . Ran Out of Food in the Last Year: Never true  Transportation Needs: Unmet Transportation Needs  . Lack of Transportation (Medical): Yes  . Lack of Transportation (Non-Medical): Yes  Physical Activity: Not on file  Stress: Not on file  Social Connections: Not on file     Family History: The patient's family history includes Bronchitis in her mother; Diabetes in her maternal grandmother; Drug abuse in her maternal grandmother; Heart murmur in  her maternal aunt; Hypertension in her maternal grandmother and paternal grandmother. There is no history of Stroke or Cancer. History of coronary artery disease notable for great grandmother. History of heart failure notable for no members. No history of cardiomyopathies including hypertrophic cardiomyopathy, left ventricular non-compaction, or arrhythmogenic right ventricular cardiomyopathy. History of arrhythmia notable for no members. Denies family history of sudden cardiac death including drowning, car accidents, or unexplained deaths in the family, except grandmother who died in her 55s. No history of bicuspid aortic valve or aortic aneurysm or dissection.  ROS:   Please see the history of present illness.    All other systems  reviewed and are negative.  EKGs/Labs/Other Studies Reviewed:    The following studies were reviewed today:  EKG:   05/12/20: Sinus arrhythmia rate 74 J point elevation consider normal variant  Cardiac Event Monitoring: Date: 01/20/21 Results:  Patient had a minimum heart rate of 50 bpm, maximum heart rate of 172 bpm, and average heart rate of 80 bpm.  Predominant underlying rhythm was sinus rhythm.  One run of supraventricular tachycardia occurred lasting 7 beats at longest with a max rate of 128 bpm at fastest.  Isolated PACs were rare (<1.0%), with rare couplets and triplets present.  Isolated PVCs were rare (<1.0%).  No evidence of complete heart block.  Triggered and diary events associated with sinus rhythm and sinus tachycardia.   No malignant arrhythmias.    Transthoracic Echocardiogram: Date: 01/07/21 Results: 1. Left ventricular ejection fraction, by estimation, is 55 to 60%. Left  ventricular ejection fraction by 3D volume is 55 %. The left ventricle has  normal function. The left ventricle has no regional wall motion  abnormalities. Left ventricular diastolic  parameters were normal.  2. Right ventricular systolic function is normal. The right ventricular  size is normal. Tricuspid regurgitation signal is inadequate for assessing  PA pressure.  3. The mitral valve is normal in structure. No evidence of mitral valve  regurgitation.  4. The aortic valve is normal in structure. Aortic valve regurgitation is  not visualized. No aortic stenosis is present.  5. The inferior vena cava is normal in size with greater than 50%  respiratory variability, suggesting right atrial pressure of 3 mmHg.   Comparison(s): No prior Echocardiogram.   Conclusion(s)/Recommendation(s): Normal biventricular function without  evidence of hemodynamically significant valvular heart disease.    Recent Labs: No results found for requested labs within last 8760 hours.  Recent  Lipid Panel No results found for: CHOL, TRIG, HDL, CHOLHDL, VLDL, LDLCALC, LDLDIRECT   Risk Assessment/Calculations:    CARPREG II - 0  Physical Exam:    VS:  LMP 06/13/2020     Wt Readings from Last 3 Encounters:  12/18/20 159 lb (72.1 kg)  09/18/20 144 lb (65.3 kg)  08/17/20 138 lb (62.6 kg)    GEN:  Well nourished, well developed in no acute distress HEENT: Normal NECK: No JVD; No carotid bruits LYMPHATICS: No lymphadenopathy CARDIAC: RRR, no murmurs, rubs, gallops RESPIRATORY:  Clear to auscultation without rales, wheezing or rhonchi  ABDOMEN: Soft, non-tender, non-distended MUSCULOSKELETAL:  No edema; No deformity  SKIN: Warm and dry NEUROLOGIC:  Alert and oriented x 3 PSYCHIATRIC:  Normal affect   ASSESSMENT:    No diagnosis found. PLAN:    In order of problems listed above:  Pregnancy with syncope - sounds like vasovagal syncope - CARPREG II Score of 0 - Low-dose aspirin (81 mg/day) prophylaxis is recommended in women at high risk of preeclampsia  and should be initiated between 12 weeks and 28 weeks of gestation (optimally before 16 weeks) and continued daily until delivery. - will get echocardiogram - will get ziopatch   Orthostatic Hypotension - asymptomatic/symptomatic*** - stress the importance of salt and water intake - on TCAs, alpha agonists, beta antagonists suitable for discontinuation *** - urinate sodium goal of 150 mEq (if symptomatic) - gave education on slow rise, Valsalva maneuver exacerbation, temperature change - discussed muscle contraction and leg crossing - discussed elevation to 30-45 degrees when sleeping - will refer to health and wellness center:  exercise deconditioning exacerbates this issue, optimally benefits from bike/row or swimming - offered compression stockings and abdominal binders - caffeine for post prandial symptoms with hydration - droxidopa (100-> 600 mg TID) or midodrine (2.5 -> 10 mg TID) - discussed risks and  benefits of Florinef inclusion (0.05 -> 0.2 daily) - plasma norepipenephrine < 220, consider atomexetine - heating abdominal pad for supine hypertension - [facility] Referral    *** follow up unless new symptoms or abnormal test results warranting change in plan  Would be reasonable for *** Video Visit Follow up  Would be reasonable for *** APP Follow up     Medication Adjustments/Labs and Tests Ordered: Current medicines are reviewed at length with the patient today.  Concerns regarding medicines are outlined above.  No orders of the defined types were placed in this encounter.  No orders of the defined types were placed in this encounter.   There are no Patient Instructions on file for this visit.   Signed, Christell Constant, MD  02/06/2021 9:19 AM    [facility]

## 2021-02-10 ENCOUNTER — Ambulatory Visit: Payer: Medicaid Other | Admitting: Internal Medicine

## 2021-02-10 NOTE — Progress Notes (Deleted)
Cardiology Office Note:    Date:  02/10/2021   ID:  Terri Potts, DOB [birth year], MRN [id number]  PCP:  Patient, No Pcp Per  [facility] Cardiologist:  Riley Lam MD [facility] Electrophysiologist:  None   CC: Follow up Syncope  History of Present Illness:    Terri Potts is a 20 y.o. female with a hx of syncope who presents for evaluation 12/18/20. In interim of this visit, patient had normal echo and ziopatch- had planned to see Cardio-obstetrics originally, but seen 02/06/21  Patient notes that he is doing ***.  Since day prior/last visit notes *** changes.  Relevant interval testing or therapy include ***.  There are no*** interval hospital/ED visit.    No chest pain or pressure ***.  No SOB/DOE*** and no PND/Orthopnea***.  No weight gain or leg swelling***.  No palpitations or syncope ***.  Ambulatory blood pressure ***.   Past Medical History:  Diagnosis Date  . Anxiety   . Eczema   . History of anemia   . Seasonal allergies   . Supervision of normal first pregnancy, antepartum 09/02/2020    Nursing Staff Provider Office Location  [facility] Dating   early [place] Language  English Anatomy US   ordered Flu Vaccine   Genetic Screen  NIPS:   AFP:     TDaP Vaccine    Hgb A1C or  GTT Early A1c-  Third trimester  COVID Vaccine    LAB RESULTS  Rhogam   Blood Type    Feeding Plan Breast Antibody   Contraception Undecided Rubella   Circumcision Yes RPR Non Reactive (07/26 1611)  Pediatrician  In  . Syncope and collapse     Past Surgical History:  Procedure Laterality Date  . NO PAST SURGERIES      Current Medications: No outpatient medications have been marked as taking for the 02/10/21 encounter (Appointment) with Christell Constant, MD.     Allergies:   Bee venom   Social History   Socioeconomic History  . Marital status: Single    Spouse name: Not on file  . Number of children: Not on file  . Years of education: Not on file  . Highest education level:  High school graduate  Occupational History  . Not on file  Tobacco Use  . Smoking status: Former Smoker    Types: Cigars  . Smokeless tobacco: Never Used  Vaping Use  . Vaping Use: Never used  Substance and Sexual Activity  . Alcohol use: Not Currently    Alcohol/week: 0.0 standard drinks  . Drug use: Not Currently    Types: Marijuana  . Sexual activity: Yes    Birth control/protection: None  Other Topics Concern  . Not on file  Social History Narrative  . Not on file   Social Determinants of Health   Financial Resource Strain: Medium Risk  . Difficulty of Paying Living Expenses: Somewhat hard  Food Insecurity: No Food Insecurity  . Worried About Programme researcher, broadcasting/film/video in the Last Year: Never true  . Ran Out of Food in the Last Year: Never true  Transportation Needs: Unmet Transportation Needs  . Lack of Transportation (Medical): Yes  . Lack of Transportation (Non-Medical): Yes  Physical Activity: Not on file  Stress: Not on file  Social Connections: Not on file     Family History: The patient's family history includes Bronchitis in her mother; Diabetes in her maternal grandmother; Drug abuse in her maternal grandmother; Heart murmur in  her maternal aunt; Hypertension in her maternal grandmother and paternal grandmother. There is no history of Stroke or Cancer. History of coronary artery disease notable for great grandmother. History of heart failure notable for no members. No history of cardiomyopathies including hypertrophic cardiomyopathy, left ventricular non-compaction, or arrhythmogenic right ventricular cardiomyopathy. History of arrhythmia notable for no members. Denies family history of sudden cardiac death including drowning, car accidents, or unexplained deaths in the family, except grandmother who died in her 55s. No history of bicuspid aortic valve or aortic aneurysm or dissection.  ROS:   Please see the history of present illness.    All other systems  reviewed and are negative.  EKGs/Labs/Other Studies Reviewed:    The following studies were reviewed today:  EKG:   05/12/20: Sinus arrhythmia rate 74 J point elevation consider normal variant  Cardiac Event Monitoring: Date: 01/20/21 Results:  Patient had a minimum heart rate of 50 bpm, maximum heart rate of 172 bpm, and average heart rate of 80 bpm.  Predominant underlying rhythm was sinus rhythm.  One run of supraventricular tachycardia occurred lasting 7 beats at longest with a max rate of 128 bpm at fastest.  Isolated PACs were rare (<1.0%), with rare couplets and triplets present.  Isolated PVCs were rare (<1.0%).  No evidence of complete heart block.  Triggered and diary events associated with sinus rhythm and sinus tachycardia.   No malignant arrhythmias.    Transthoracic Echocardiogram: Date: 01/07/21 Results: 1. Left ventricular ejection fraction, by estimation, is 55 to 60%. Left  ventricular ejection fraction by 3D volume is 55 %. The left ventricle has  normal function. The left ventricle has no regional wall motion  abnormalities. Left ventricular diastolic  parameters were normal.  2. Right ventricular systolic function is normal. The right ventricular  size is normal. Tricuspid regurgitation signal is inadequate for assessing  PA pressure.  3. The mitral valve is normal in structure. No evidence of mitral valve  regurgitation.  4. The aortic valve is normal in structure. Aortic valve regurgitation is  not visualized. No aortic stenosis is present.  5. The inferior vena cava is normal in size with greater than 50%  respiratory variability, suggesting right atrial pressure of 3 mmHg.   Comparison(s): No prior Echocardiogram.   Conclusion(s)/Recommendation(s): Normal biventricular function without  evidence of hemodynamically significant valvular heart disease.    Recent Labs: No results found for requested labs within last 8760 hours.  Recent  Lipid Panel No results found for: CHOL, TRIG, HDL, CHOLHDL, VLDL, LDLCALC, LDLDIRECT   Risk Assessment/Calculations:    CARPREG II - 0  Physical Exam:    VS:  LMP 06/13/2020     Wt Readings from Last 3 Encounters:  12/18/20 159 lb (72.1 kg)  09/18/20 144 lb (65.3 kg)  08/17/20 138 lb (62.6 kg)    GEN:  Well nourished, well developed in no acute distress HEENT: Normal NECK: No JVD; No carotid bruits LYMPHATICS: No lymphadenopathy CARDIAC: RRR, no murmurs, rubs, gallops RESPIRATORY:  Clear to auscultation without rales, wheezing or rhonchi  ABDOMEN: Soft, non-tender, non-distended MUSCULOSKELETAL:  No edema; No deformity  SKIN: Warm and dry NEUROLOGIC:  Alert and oriented x 3 PSYCHIATRIC:  Normal affect   ASSESSMENT:    No diagnosis found. PLAN:    In order of problems listed above:  Pregnancy with syncope - sounds like vasovagal syncope - CARPREG II Score of 0 - Low-dose aspirin (81 mg/day) prophylaxis is recommended in women at high risk of preeclampsia  and should be initiated between 12 weeks and 28 weeks of gestation (optimally before 16 weeks) and continued daily until delivery. - will get echocardiogram - will get ziopatch   Orthostatic Hypotension - asymptomatic/symptomatic*** - stress the importance of salt and water intake - on TCAs, alpha agonists, beta antagonists suitable for discontinuation *** - urinate sodium goal of 150 mEq (if symptomatic) - gave education on slow rise, Valsalva maneuver exacerbation, temperature change - discussed muscle contraction and leg crossing - discussed elevation to 30-45 degrees when sleeping - will refer to health and wellness center:  exercise deconditioning exacerbates this issue, optimally benefits from bike/row or swimming - offered compression stockings and abdominal binders - caffeine for post prandial symptoms with hydration - droxidopa (100-> 600 mg TID) or midodrine (2.5 -> 10 mg TID) - discussed risks and  benefits of Florinef inclusion (0.05 -> 0.2 daily) - plasma norepipenephrine < 220, consider atomexetine - heating abdominal pad for supine hypertension - [facility] Referral    *** follow up unless new symptoms or abnormal test results warranting change in plan  Would be reasonable for *** Video Visit Follow up  Would be reasonable for *** APP Follow up     Medication Adjustments/Labs and Tests Ordered: Current medicines are reviewed at length with the patient today.  Concerns regarding medicines are outlined above.  No orders of the defined types were placed in this encounter.  No orders of the defined types were placed in this encounter.   There are no Patient Instructions on file for this visit.   Signed, Christell Constant, MD  02/10/2021 12:00 PM    [facility]

## 2021-03-06 IMAGING — US US OB COMP LESS 14 WK
1 series · 15 of 28 positions shown · non-contrast
Comparison: None.

CLINICAL DATA: Abdominal pain

EXAM:
OBSTETRIC <14 WK ULTRASOUND
TECHNIQUE: Transabdominal ultrasound was performed for evaluation of the
gestation as well as the maternal uterus and adnexal regions.

[Series 1: us ob comp less 14 wk · 30 acquisitions, 15 frames shown]
[im 1/30]
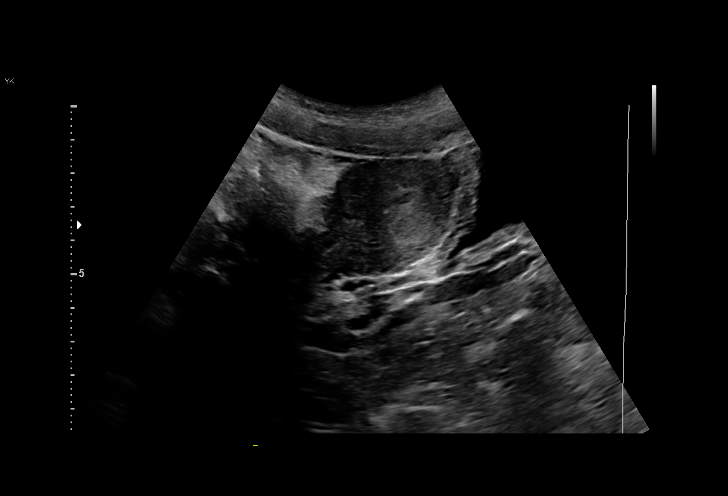
[im 3/30]
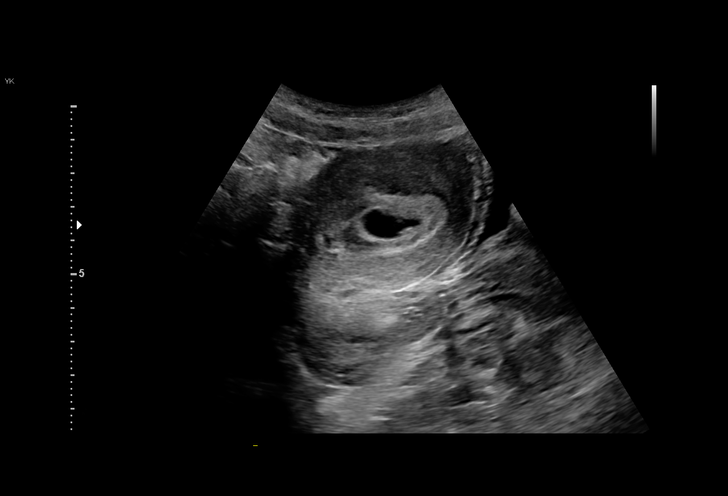
[im 5/30]
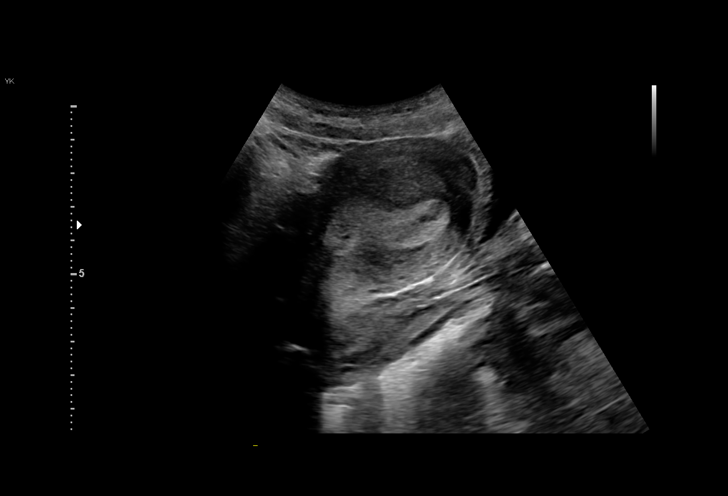
[im 7/30]
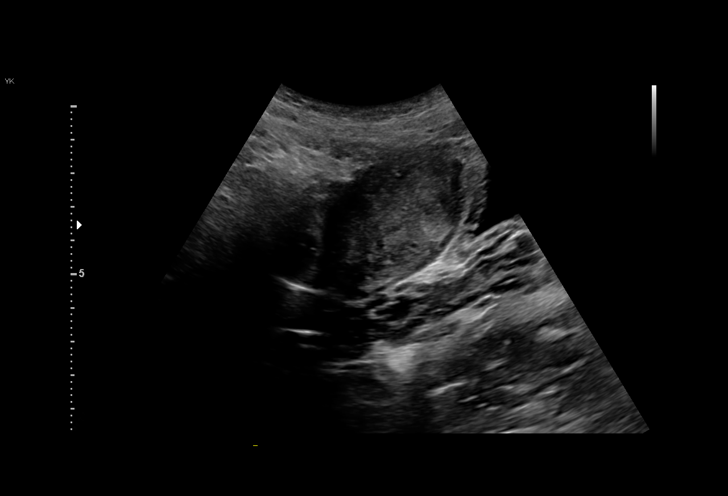
[im 9/30]
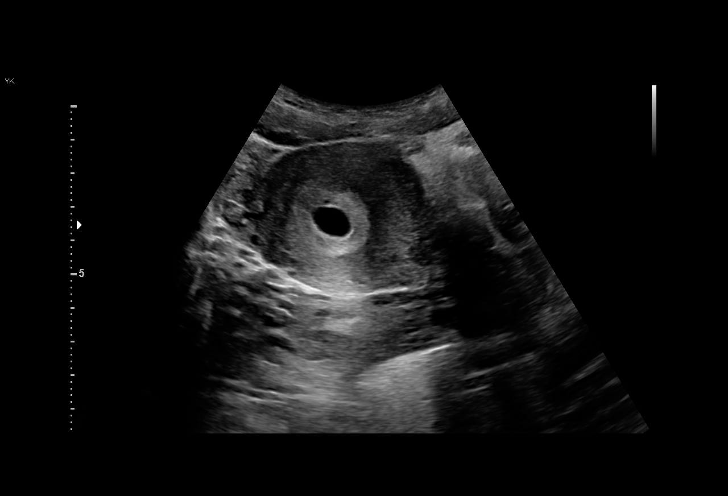
[im 11/30]
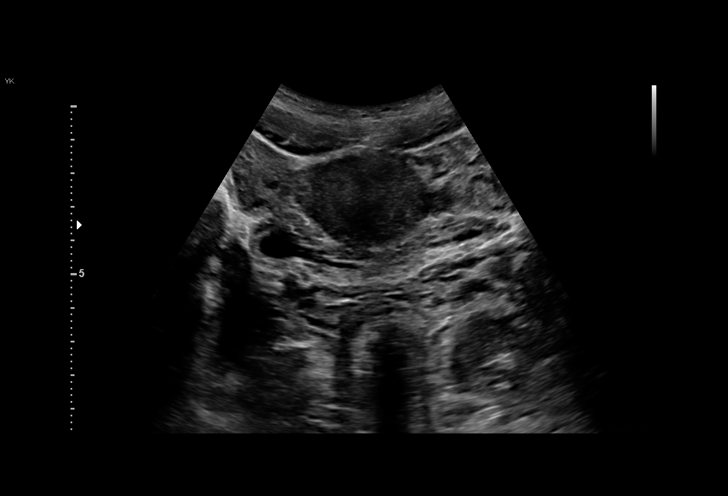
[im 13/30]
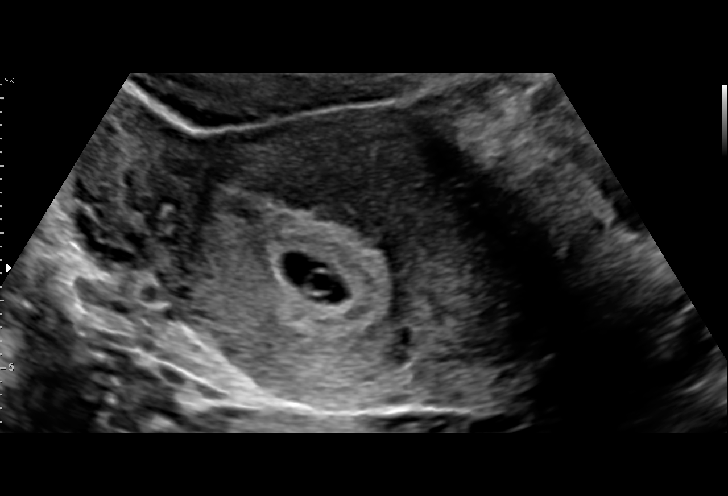
[im 16/30]
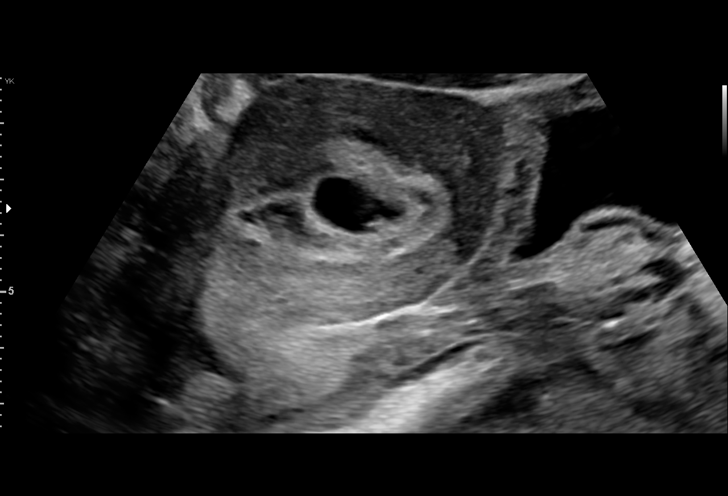
[im 17/30]
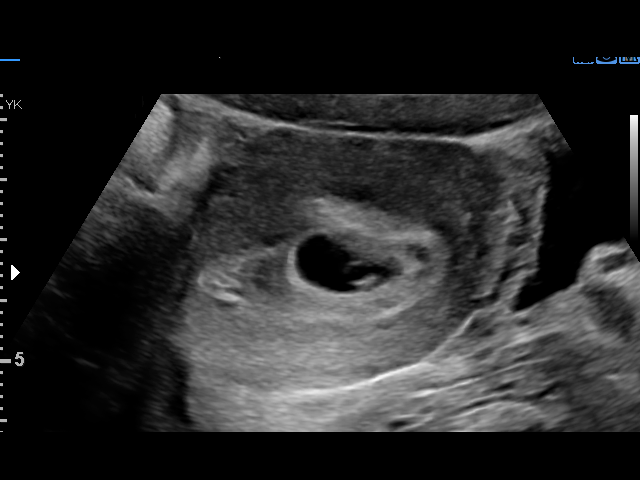
[im 19/30]
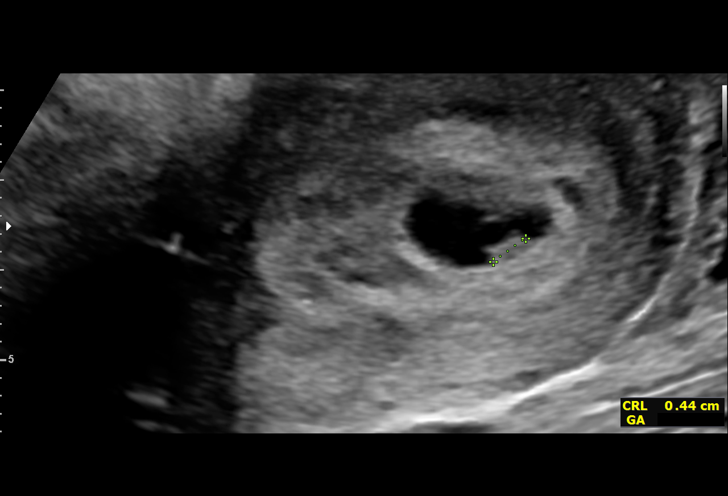
[im 21/30]
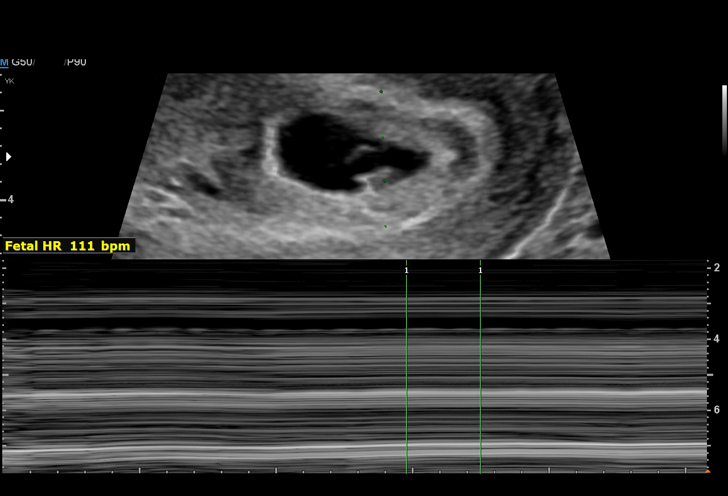
[im 23/30]
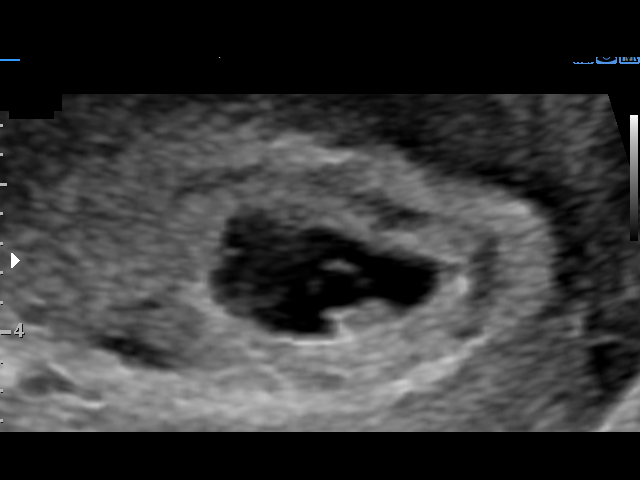
[im 25/30]
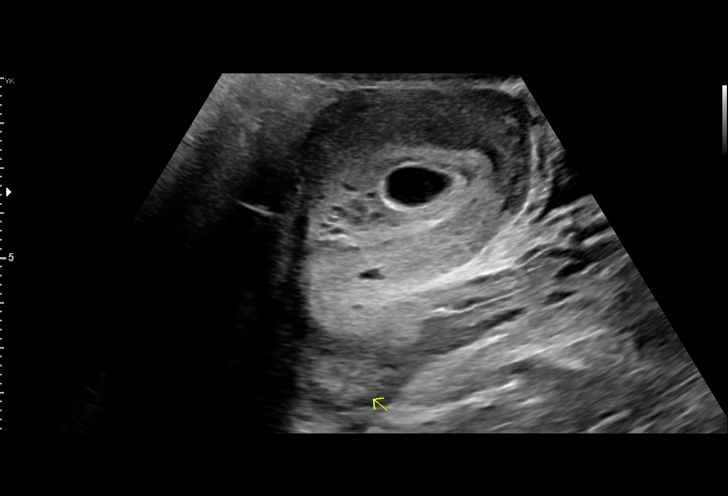
[im 27/30]
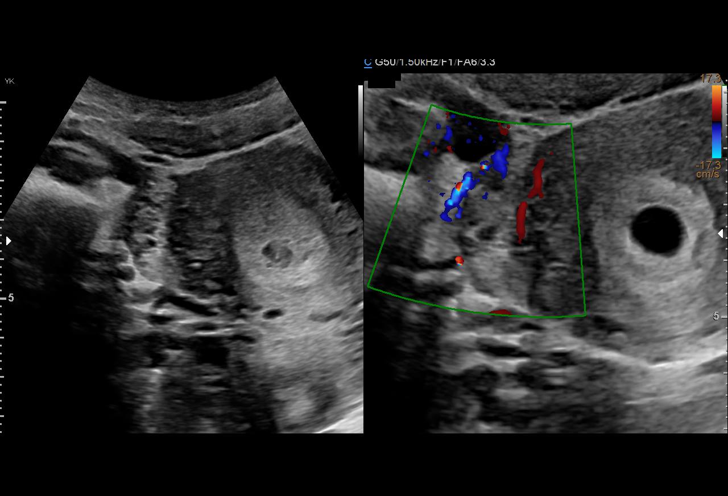
[im 30/30]
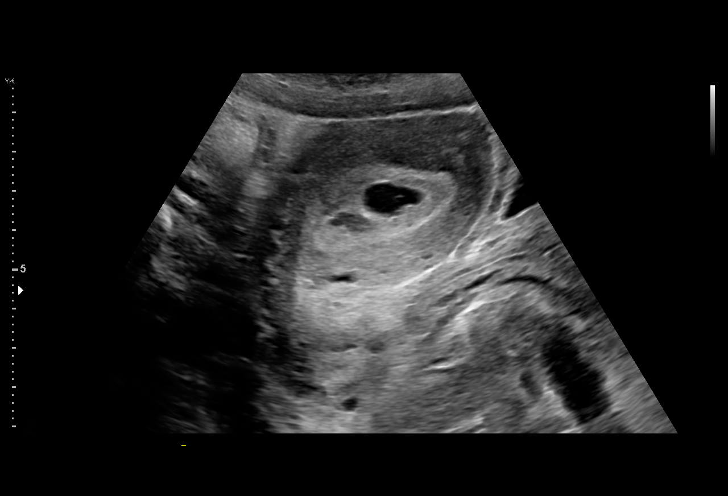

[15 of 28 positions shown; findings below may reference images not displayed]

FINDINGS: Intrauterine gestational sac: Single

Yolk sac:  Visualized.

Embryo:  Visualized.

Cardiac Activity: Visualized.

Heart Rate: 112 bpm

CRL:   4.0 mm   6 w 1 d                  US EDC: 03/28/2020

Subchorionic hemorrhage: Moderate subchorionic hemorrhage is seen
along the posterior aspect of the gestational sac. Hemorrhage
measures approximately 3.3 cm in greatest length, and approximately
0.9 cm in greatest with. The subchorionic hemorrhage extends along
50% of the circumference of the gestational sac.

Maternal uterus/adnexae: Left ovary measures 3.6 x 2.1 x 2.0 cm and
the right ovary measures 2.8 x 1.2 x 1.2 cm. No adnexal masses or
free fluid.
IMPRESSION: 1. Single live intrauterine pregnancy as above estimated age 6 weeks
and 1 day.
2. Moderate subchorionic hemorrhage.

## 2021-03-22 ENCOUNTER — Other Ambulatory Visit: Payer: Self-pay

## 2021-03-23 ENCOUNTER — Inpatient Hospital Stay (HOSPITAL_COMMUNITY): Payer: Medicaid Other | Admitting: Anesthesiology

## 2021-03-23 ENCOUNTER — Other Ambulatory Visit: Payer: Self-pay

## 2021-03-23 ENCOUNTER — Inpatient Hospital Stay (HOSPITAL_COMMUNITY)
Admission: AD | Admit: 2021-03-23 | Discharge: 2021-03-25 | DRG: 807 | Disposition: A | Discharge status: Home / Self Care | Payer: Medicaid Other | Attending: Obstetrics and Gynecology | Admitting: Obstetrics and Gynecology

## 2021-03-23 ENCOUNTER — Encounter (HOSPITAL_COMMUNITY): Payer: Self-pay | Admitting: Obstetrics and Gynecology

## 2021-03-23 DIAGNOSIS — Z20822 Contact with and (suspected) exposure to covid-19: Secondary | ICD-10-CM | POA: Diagnosis present

## 2021-03-23 DIAGNOSIS — O99824 Streptococcus B carrier state complicating childbirth: Secondary | ICD-10-CM | POA: Diagnosis present

## 2021-03-23 DIAGNOSIS — O4292 Full-term premature rupture of membranes, unspecified as to length of time between rupture and onset of labor: Secondary | ICD-10-CM | POA: Diagnosis present

## 2021-03-23 DIAGNOSIS — Z3A39 39 weeks gestation of pregnancy: Secondary | ICD-10-CM | POA: Diagnosis not present

## 2021-03-23 DIAGNOSIS — O26893 Other specified pregnancy related conditions, third trimester: Secondary | ICD-10-CM | POA: Diagnosis present

## 2021-03-23 DIAGNOSIS — Z87891 Personal history of nicotine dependence: Secondary | ICD-10-CM

## 2021-03-23 LAB — CBC
HCT: 34 % — ABNORMAL LOW (ref 36.0–46.0)
Hemoglobin: 11.2 g/dL — ABNORMAL LOW (ref 12.0–15.0)
MCH: 29.1 pg (ref 26.0–34.0)
MCHC: 32.9 g/dL (ref 30.0–36.0)
MCV: 88.3 fL (ref 80.0–100.0)
Platelets: 143 10*3/uL — ABNORMAL LOW (ref 150–400)
RBC: 3.85 MIL/uL — ABNORMAL LOW (ref 3.87–5.11)
RDW: 14 % (ref 11.5–15.5)
WBC: 9.8 10*3/uL (ref 4.0–10.5)
nRBC: 0 % (ref 0.0–0.2)

## 2021-03-23 LAB — RESP PANEL BY RT-PCR (FLU A&B, COVID) ARPGX2
Influenza A by PCR: NEGATIVE
Influenza B by PCR: NEGATIVE
SARS Coronavirus 2 by RT PCR: NEGATIVE

## 2021-03-23 LAB — TYPE AND SCREEN
ABO/RH(D): A POS
Antibody Screen: NEGATIVE

## 2021-03-23 LAB — POCT FERN TEST: POCT Fern Test: POSITIVE

## 2021-03-23 LAB — RPR: RPR Ser Ql: NONREACTIVE

## 2021-03-23 MED ORDER — EPHEDRINE 5 MG/ML INJ: 10.0000 mg | INTRAVENOUS | Status: DC | PRN

## 2021-03-23 MED ORDER — FERROUS SULFATE 325 (65 FE) MG PO TABS
325.0000 mg | ORAL_TABLET | Freq: Two times a day (BID) | ORAL | Status: DC
  Administered 2021-03-23 – 2021-03-25 (×4): 325 mg via ORAL
  Filled 2021-03-23 (×4): qty 1

## 2021-03-23 MED ORDER — TERBUTALINE SULFATE 1 MG/ML IJ SOLN: 0.2500 mg | Freq: Once | INTRAMUSCULAR | Status: DC | PRN

## 2021-03-23 MED ORDER — ONDANSETRON HCL 4 MG/2ML IJ SOLN: 4.0000 mg | Freq: Four times a day (QID) | INTRAMUSCULAR | Status: DC | PRN

## 2021-03-23 MED ORDER — SIMETHICONE 80 MG PO CHEW: 80.0000 mg | CHEWABLE_TABLET | ORAL | Status: DC | PRN

## 2021-03-23 MED ORDER — PRENATAL MULTIVITAMIN CH
1.0000 | ORAL_TABLET | Freq: Every day | ORAL | Status: DC
  Administered 2021-03-24 – 2021-03-25 (×2): 1 via ORAL
  Filled 2021-03-23 (×2): qty 1

## 2021-03-23 MED ORDER — METHYLERGONOVINE MALEATE 0.2 MG PO TABS: 0.2000 mg | ORAL_TABLET | ORAL | Status: DC | PRN

## 2021-03-23 MED ORDER — MAGNESIUM HYDROXIDE 400 MG/5ML PO SUSP: 30.0000 mL | ORAL | Status: DC | PRN

## 2021-03-23 MED ORDER — SODIUM CHLORIDE 0.9 % IV SOLN: 250.0000 mL | INTRAVENOUS | Status: DC | PRN

## 2021-03-23 MED ORDER — MEASLES, MUMPS & RUBELLA VAC IJ SOLR: 0.5000 mL | Freq: Once | INTRAMUSCULAR | Status: DC

## 2021-03-23 MED ORDER — PHENYLEPHRINE 40 MCG/ML (10ML) SYRINGE FOR IV PUSH (FOR BLOOD PRESSURE SUPPORT): 80.0000 ug | PREFILLED_SYRINGE | INTRAVENOUS | Status: DC | PRN

## 2021-03-23 MED ORDER — ACETAMINOPHEN 325 MG PO TABS: 650.0000 mg | ORAL_TABLET | ORAL | Status: DC | PRN

## 2021-03-23 MED ORDER — BUTORPHANOL TARTRATE 1 MG/ML IJ SOLN: 1.0000 mg | INTRAMUSCULAR | Status: DC | PRN

## 2021-03-23 MED ORDER — SENNOSIDES-DOCUSATE SODIUM 8.6-50 MG PO TABS
2.0000 | ORAL_TABLET | Freq: Every day | ORAL | Status: DC
  Administered 2021-03-24 – 2021-03-25 (×2): 2 via ORAL
  Filled 2021-03-23 (×2): qty 2

## 2021-03-23 MED ORDER — OXYTOCIN BOLUS FROM INFUSION
333.0000 mL | Freq: Once | INTRAVENOUS | Status: AC
  Administered 2021-03-23: 333 mL via INTRAVENOUS

## 2021-03-23 MED ORDER — BUTORPHANOL TARTRATE 1 MG/ML IJ SOLN
INTRAMUSCULAR | Status: AC
  Administered 2021-03-23: 1 mg
  Filled 2021-03-23: qty 1

## 2021-03-23 MED ORDER — ONDANSETRON HCL 4 MG/2ML IJ SOLN: 4.0000 mg | INTRAMUSCULAR | Status: DC | PRN

## 2021-03-23 MED ORDER — OXYTOCIN-SODIUM CHLORIDE 30-0.9 UT/500ML-% IV SOLN: 2.5000 [IU]/h | INTRAVENOUS | Status: DC

## 2021-03-23 MED ORDER — OXYCODONE-ACETAMINOPHEN 5-325 MG PO TABS: 2.0000 | ORAL_TABLET | ORAL | Status: DC | PRN

## 2021-03-23 MED ORDER — LACTATED RINGERS IV SOLN: 500.0000 mL | INTRAVENOUS | Status: DC | PRN

## 2021-03-23 MED ORDER — TETANUS-DIPHTH-ACELL PERTUSSIS 5-2.5-18.5 LF-MCG/0.5 IM SUSY: 0.5000 mL | PREFILLED_SYRINGE | Freq: Once | INTRAMUSCULAR | Status: DC

## 2021-03-23 MED ORDER — METHYLERGONOVINE MALEATE 0.2 MG/ML IJ SOLN: 0.2000 mg | INTRAMUSCULAR | Status: DC | PRN

## 2021-03-23 MED ORDER — OXYCODONE-ACETAMINOPHEN 5-325 MG PO TABS: 1.0000 | ORAL_TABLET | ORAL | Status: DC | PRN

## 2021-03-23 MED ORDER — FENTANYL CITRATE (PF) 100 MCG/2ML IJ SOLN
INTRAMUSCULAR | Status: AC
  Filled 2021-03-23: qty 2

## 2021-03-23 MED ORDER — FENTANYL CITRATE (PF) 100 MCG/2ML IJ SOLN
INTRAMUSCULAR | Status: DC | PRN
  Administered 2021-03-23: 100 ug via EPIDURAL

## 2021-03-23 MED ORDER — BENZOCAINE-MENTHOL 20-0.5 % EX AERO
1.0000 "application " | INHALATION_SPRAY | CUTANEOUS | Status: DC | PRN
  Filled 2021-03-23: qty 56

## 2021-03-23 MED ORDER — OXYTOCIN-SODIUM CHLORIDE 30-0.9 UT/500ML-% IV SOLN
1.0000 m[IU]/min | INTRAVENOUS | Status: DC
  Administered 2021-03-23: 2 m[IU]/min via INTRAVENOUS
  Filled 2021-03-23: qty 500

## 2021-03-23 MED ORDER — WITCH HAZEL-GLYCERIN EX PADS: 1.0000 "application " | MEDICATED_PAD | CUTANEOUS | Status: DC | PRN

## 2021-03-23 MED ORDER — ZOLPIDEM TARTRATE 5 MG PO TABS: 5.0000 mg | ORAL_TABLET | Freq: Every evening | ORAL | Status: DC | PRN

## 2021-03-23 MED ORDER — COCONUT OIL OIL
1.0000 "application " | TOPICAL_OIL | Status: DC | PRN
  Administered 2021-03-24: 1 via TOPICAL

## 2021-03-23 MED ORDER — LACTATED RINGERS IV SOLN: INTRAVENOUS | Status: DC

## 2021-03-23 MED ORDER — ONDANSETRON HCL 4 MG PO TABS: 4.0000 mg | ORAL_TABLET | ORAL | Status: DC | PRN

## 2021-03-23 MED ORDER — LACTATED RINGERS IV SOLN
500.0000 mL | Freq: Once | INTRAVENOUS | Status: AC
  Administered 2021-03-23: 500 mL via INTRAVENOUS

## 2021-03-23 MED ORDER — SODIUM CHLORIDE 0.9% FLUSH: 3.0000 mL | Freq: Two times a day (BID) | INTRAVENOUS | Status: DC

## 2021-03-23 MED ORDER — FLEET ENEMA 7-19 GM/118ML RE ENEM: 1.0000 | ENEMA | RECTAL | Status: DC | PRN

## 2021-03-23 MED ORDER — FENTANYL-BUPIVACAINE-NACL 0.5-0.125-0.9 MG/250ML-% EP SOLN
EPIDURAL | Status: DC | PRN
  Administered 2021-03-23: 12 mL/h via EPIDURAL

## 2021-03-23 MED ORDER — SOD CITRATE-CITRIC ACID 500-334 MG/5ML PO SOLN: 30.0000 mL | ORAL | Status: DC | PRN

## 2021-03-23 MED ORDER — FENTANYL-BUPIVACAINE-NACL 0.5-0.125-0.9 MG/250ML-% EP SOLN
12.0000 mL/h | EPIDURAL | Status: DC | PRN
  Filled 2021-03-23: qty 250

## 2021-03-23 MED ORDER — LIDOCAINE HCL (PF) 1 % IJ SOLN: 30.0000 mL | INTRAMUSCULAR | Status: DC | PRN

## 2021-03-23 MED ORDER — DIPHENHYDRAMINE HCL 25 MG PO CAPS: 25.0000 mg | ORAL_CAPSULE | Freq: Four times a day (QID) | ORAL | Status: DC | PRN

## 2021-03-23 MED ORDER — SODIUM CHLORIDE 0.9 % IV SOLN
5.0000 10*6.[IU] | Freq: Once | INTRAVENOUS | Status: AC
  Administered 2021-03-23: 5 10*6.[IU] via INTRAVENOUS
  Filled 2021-03-23: qty 5

## 2021-03-23 MED ORDER — LIDOCAINE HCL (PF) 1 % IJ SOLN
INTRAMUSCULAR | Status: DC | PRN
  Administered 2021-03-23: 2 mL via EPIDURAL
  Administered 2021-03-23: 10 mL via EPIDURAL

## 2021-03-23 MED ORDER — IBUPROFEN 800 MG PO TABS
800.0000 mg | ORAL_TABLET | Freq: Three times a day (TID) | ORAL | Status: DC
  Administered 2021-03-23 – 2021-03-25 (×6): 800 mg via ORAL
  Filled 2021-03-23 (×6): qty 1

## 2021-03-23 MED ORDER — DIPHENHYDRAMINE HCL 50 MG/ML IJ SOLN: 12.5000 mg | INTRAMUSCULAR | Status: DC | PRN

## 2021-03-23 MED ORDER — ACETAMINOPHEN 325 MG PO TABS
650.0000 mg | ORAL_TABLET | ORAL | Status: DC | PRN
  Administered 2021-03-24: 650 mg via ORAL
  Filled 2021-03-23: qty 2

## 2021-03-23 MED ORDER — PENICILLIN G POT IN DEXTROSE 60000 UNIT/ML IV SOLN
3.0000 10*6.[IU] | INTRAVENOUS | Status: DC
  Administered 2021-03-23: 3 10*6.[IU] via INTRAVENOUS
  Filled 2021-03-23 (×2): qty 50

## 2021-03-23 MED ORDER — BUPIVACAINE HCL (PF) 0.25 % IJ SOLN
INTRAMUSCULAR | Status: DC | PRN
  Administered 2021-03-23: 8 mL via EPIDURAL

## 2021-03-23 MED ORDER — DIBUCAINE (PERIANAL) 1 % EX OINT: 1.0000 "application " | TOPICAL_OINTMENT | CUTANEOUS | Status: DC | PRN

## 2021-03-23 MED ORDER — SODIUM CHLORIDE 0.9% FLUSH: 3.0000 mL | INTRAVENOUS | Status: DC | PRN

## 2021-03-23 NOTE — Lactation Note (Signed)
This note was copied from a baby's chart. Lactation Consultation Note  Patient Name: Terri Potts Date: 03/23/2021 Reason for consult: L&D Initial assessment;Mother's request;Primapara;1st time breastfeeding;Term;Other (Comment) (Anemia) Age:21 hours  LC assisted Mom latching infant at the breasts with signs of milk transfer. LC gave droplets of colostrum and did some suck training before latching.   LC support to continue on the floor.   Maternal Data Has patient been taught Hand Expression?: Yes Does the patient have breastfeeding experience prior to this delivery?: No  Feeding Mother's Current Feeding Choice: Breast Milk  LATCH Score Latch: Repeated attempts needed to sustain latch, nipple held in mouth throughout feeding, stimulation needed to elicit sucking reflex.  Audible Swallowing: Spontaneous and intermittent  Type of Nipple: Everted at rest and after stimulation  Comfort (Breast/Nipple): Soft / non-tender  Hold (Positioning): Assistance needed to correctly position infant at breast and maintain latch.  LATCH Score: 8   Lactation Tools Discussed/Used    Interventions Interventions: Breast feeding basics reviewed;Breast compression;Assisted with latch;Adjust position;Skin to skin;Breast massage;Hand express;Expressed milk;Education  Discharge    Consult Status Consult Status: Follow-up Date: 03/24/21 Follow-up type: In-patient    Terri Braddy  Potts 03/23/2021, 2:10 PM

## 2021-03-23 NOTE — H&P (Signed)
20 y.o. [redacted]w[redacted]d  G1P0000 comes in c/o labor and ROM clear in MAU at about 0100.  Otherwise has good fetal movement and no bleeding.  Past Medical History:  Diagnosis Date  . Anxiety   . Eczema   . History of anemia   . Seasonal allergies   . Supervision of normal first pregnancy, antepartum 09/02/2020    Nursing Staff Provider Office Location  [facility] Dating   early [place] Language  English Anatomy US   ordered Flu Vaccine   Genetic Screen  NIPS:   AFP:     TDaP Vaccine    Hgb A1C or  GTT Early A1c-  Third trimester  COVID Vaccine    LAB RESULTS  Rhogam   Blood Type    Feeding Plan Breast Antibody   Contraception Undecided Rubella   Circumcision Yes RPR Non Reactive (07/26 1611)  Pediatrician  In  . Syncope and collapse     Past Surgical History:  Procedure Laterality Date  . NO PAST SURGERIES      OB History  Gravida Para Term Preterm AB Living  1 0 0 0 0 0  SAB IAB Ectopic Multiple Live Births  0 0 0 0      # Outcome Date GA Lbr Len/2nd Weight Sex Delivery Anes PTL Lv  1 Current             Social History   Socioeconomic History  . Marital status: Single    Spouse name: Not on file  . Number of children: Not on file  . Years of education: Not on file  . Highest education level: High school graduate  Occupational History  . Not on file  Tobacco Use  . Smoking status: Former Smoker    Types: Cigars  . Smokeless tobacco: Never Used  Vaping Use  . Vaping Use: Never used  Substance and Sexual Activity  . Alcohol use: Not Currently    Alcohol/week: 0.0 standard drinks  . Drug use: Not Currently    Types: Marijuana  . Sexual activity: Not Currently    Birth control/protection: None  Other Topics Concern  . Not on file  Social History Narrative  . Not on file   Social Determinants of Health   Financial Resource Strain: Medium Risk  . Difficulty of Paying Living Expenses: Somewhat hard  Food Insecurity: No Food Insecurity  . Worried About Programme researcher, broadcasting/film/video in the  Last Year: Never true  . Ran Out of Food in the Last Year: Never true  Transportation Needs: Unmet Transportation Needs  . Lack of Transportation (Medical): Yes  . Lack of Transportation (Non-Medical): Yes  Physical Activity: Not on file  Stress: Not on file  Social Connections: Not on file  Intimate Partner Violence: Not At Risk  . Fear of Current or Ex-Partner: No  . Emotionally Abused: No  . Physically Abused: No  . Sexually Abused: No   Bee venom    Prenatal Transfer Tool  Maternal Diabetes: No Genetic Screening: Normal Maternal Ultrasounds/Referrals: Normal Fetal Ultrasounds or other Referrals:  None Maternal Substance Abuse:  No Significant Maternal Medications:  None Significant Maternal Lab Results: Group B Strep positive  Other PNC: uncomplicated.    Vitals:   03/23/21 0830 03/23/21 0835 03/23/21 0845 03/23/21 0900  BP: 121/90 120/85 127/83 113/72  Pulse: 62 66 65 67  Resp: 18 18 18 18   Temp:      TempSrc:      SpO2: 100% 100% 100%  Weight:      Height:        Lungs/Cor:  NAD Abdomen:  soft, gravid Ex:  no cords, erythema SVE:  3/90/-1 per nurse FHTs:  120s, good STV, NST R; Cat 1 tracing. Toco:  q 5   A/P   Term labor, latent, SROM and on pit.  GBS POS- PCN.  Loney Laurence

## 2021-03-23 NOTE — Lactation Note (Signed)
This note was copied from a baby's chart. Lactation Consultation Note  Patient Name: Terri Potts OVZCH'Y Date: 03/23/2021 Reason for consult: Follow-up assessment;Mother's request;Difficult latch;Primapara;1st time breastfeeding;Late-preterm 34-36.6wks;Infant < 6lbs Age:21 hours   LC assisted Mom with latching infant at the breasts. Infant tongue thrusting and pushing off the breasts. LC used 20 NS but too large. Infant has small mouth and a high palate. Infant will extend tongue pass the gum line. RN arrived gave glucose gel. Infant more alert able to latch for 6 minutes at breast on right side. With a curve tip syringe infant able to take 2 ml. Episodes with paced bottle feeding using extra slow flow nipple infant gagging.  RN, Maricela Bo Core alerted about findings above.  RN to switch to 22 cal/oz formula.   LC reviewed how to reduce calorie loss with LPTI. LC also talked with Mom about keep total feeding under 30 minutes.   Mom set up on DEBP to pump q 3hrs for 15 minutes.   Plan 1. To feed based on cues 8-12x in 24 hr period no more than 3 hrs without an attempt.           2. Grandma to assist with supplementing with EBM/ Formula using curve tip syringe and finger feeding.           3. Mom to pump as stated above.           4. I and O sheet reviewed.            5. LC brochure of inpatient and outpatient services reviewed.  All questions answered at the end of the visit.    Maternal Data Has patient been taught Hand Expression?: Yes Does the patient have breastfeeding experience prior to this delivery?: No  Feeding Mother's Current Feeding Choice: Breast Milk and Formula  LATCH Score Latch: Repeated attempts needed to sustain latch, nipple held in mouth throughout feeding, stimulation needed to elicit sucking reflex.  Audible Swallowing: A few with stimulation  Type of Nipple: Flat (will evert but compressible)  Comfort (Breast/Nipple): Soft / non-tender  Hold  (Positioning): Assistance needed to correctly position infant at breast and maintain latch.  LATCH Score: 6   Lactation Tools Discussed/Used Tools: Pump;Flanges Flange Size: 24 Breast pump type: Double-Electric Breast Pump Pump Education: Setup, frequency, and cleaning;Milk Storage Reason for Pumping: increase stimulation Pumping frequency: every 3 hrs for 15 minutes  Interventions Interventions: Breast feeding basics reviewed;Breast compression;Assisted with latch;Adjust position;Skin to skin;Support pillows;Breast massage;Position options;Hand express;Expressed milk;Education;DEBP  Discharge Pump: Personal WIC Program: Yes  Consult Status Consult Status: Follow-up Date: 03/24/21 Follow-up type: In-patient    Terri Potts  Terri Potts 03/23/2021, 8:20 PM

## 2021-03-23 NOTE — MAU Note (Signed)
Pt reports ctx started 2300 last night. 5-10 min apart, 8/10, denies LOF and VB. Reports +FM

## 2021-03-23 NOTE — Anesthesia Preprocedure Evaluation (Signed)
Anesthesia Evaluation  Patient identified by MRN, date of birth, ID band Patient awake    Reviewed: Allergy & Precautions, Patient's Chart, lab work & pertinent test results  Airway Mallampati: II  TM Distance: >3 FB Neck ROM: Full    Dental no notable dental hx.    Pulmonary former smoker,    Pulmonary exam normal breath sounds clear to auscultation       Cardiovascular negative cardio ROS Normal cardiovascular exam Rhythm:Regular Rate:Normal     Neuro/Psych PSYCHIATRIC DISORDERS Anxiety negative neurological ROS     GI/Hepatic negative GI ROS, Neg liver ROS,   Endo/Other  negative endocrine ROS  Renal/GU negative Renal ROS  negative genitourinary   Musculoskeletal negative musculoskeletal ROS (+)   Abdominal   Peds negative pediatric ROS (+)  Hematology  (+) Blood dyscrasia, anemia , hct 34, plt 143   Anesthesia Other Findings   Reproductive/Obstetrics (+) Pregnancy                             Anesthesia Physical Anesthesia Plan  ASA: II and emergent  Anesthesia Plan: Epidural   Post-op Pain Management:    Induction:   PONV Risk Score and Plan: 2  Airway Management Planned: Natural Airway  Additional Equipment: None  Intra-op Plan:   Post-operative Plan:   Informed Consent: I have reviewed the patients History and Physical, chart, labs and discussed the procedure including the risks, benefits and alternatives for the proposed anesthesia with the patient or authorized representative who has indicated his/her understanding and acceptance.       Plan Discussed with:   Anesthesia Plan Comments:         Anesthesia Quick Evaluation

## 2021-03-23 NOTE — Anesthesia Procedure Notes (Signed)
Epidural Patient location during procedure: OB Start time: 03/23/2021 8:11 AM End time: 03/23/2021 8:20 AM  Staffing Anesthesiologist: Lannie Fields, DO Performed: anesthesiologist   Preanesthetic Checklist Completed: patient identified, IV checked, risks and benefits discussed, monitors and equipment checked, pre-op evaluation and timeout performed  Epidural Patient position: sitting Prep: DuraPrep and site prepped and draped Patient monitoring: continuous pulse ox, blood pressure, heart rate and cardiac monitor Approach: midline Location: L3-L4 Injection technique: LOR air  Needle:  Needle type: Tuohy  Needle gauge: 17 G Needle length: 9 cm Needle insertion depth: 8 cm Catheter type: closed end flexible Catheter size: 19 Gauge Catheter at skin depth: 14 cm Test dose: negative  Assessment Sensory level: T8 Events: blood not aspirated, injection not painful, no injection resistance, no paresthesia and negative IV test  Additional Notes Patient identified. Risks/Benefits/Options discussed with patient including but not limited to bleeding, infection, nerve damage, paralysis, failed block, incomplete pain control, headache, blood pressure changes, nausea, vomiting, reactions to medication both or allergic, itching and postpartum back pain. Confirmed with bedside nurse the patient's most recent platelet count. Confirmed with patient that they are not currently taking any anticoagulation, have any bleeding history or any family history of bleeding disorders. Patient expressed understanding and wished to proceed. All questions were answered. Sterile technique was used throughout the entire procedure. Please see nursing notes for vital signs. Test dose was given through epidural catheter and negative prior to continuing to dose epidural or start infusion. Warning signs of high block given to the patient including shortness of breath, tingling/numbness in hands, complete motor  block, or any concerning symptoms with instructions to call for help. Patient was given instructions on fall risk and not to get out of bed. All questions and concerns addressed with instructions to call with any issues or inadequate analgesia.  Reason for block:procedure for pain

## 2021-03-24 LAB — CBC
HCT: 27.7 % — ABNORMAL LOW (ref 36.0–46.0)
Hemoglobin: 9.3 g/dL — ABNORMAL LOW (ref 12.0–15.0)
MCH: 29 pg (ref 26.0–34.0)
MCHC: 33.6 g/dL (ref 30.0–36.0)
MCV: 86.3 fL (ref 80.0–100.0)
Platelets: 128 10*3/uL — ABNORMAL LOW (ref 150–400)
RBC: 3.21 MIL/uL — ABNORMAL LOW (ref 3.87–5.11)
RDW: 13.9 % (ref 11.5–15.5)
WBC: 9.5 10*3/uL (ref 4.0–10.5)
nRBC: 0 % (ref 0.0–0.2)

## 2021-03-24 NOTE — Lactation Note (Signed)
This note was copied from a baby's chart. Lactation Consultation Note  Patient Name: Terri Potts [id number] Date: 03/24/2021 Reason for consult: Follow-up assessment;Mother's request;Difficult latch;Primapara;1st time breastfeeding;Term Age:21 hours   Mom pumped once today 11 ml of EBM. LC reinforced need to pump q 3hrs for 15 minutes after latching to maintain her milk supply.   Mom states some pain on her left nipple. LC noted no signs of compression stripe or redness. LC assessed flange size increased her from 24 to 27. Mom set up on DEBP and stated flange size better and her pain resolved.   Infant fed prior to visit. Mom to call when infant cues to get assistance latching at the breasts.   LC reviewed LPTI guidelines for supplementation. Mom to offer EBM followed by formula to meet supplementation guidelines based on hrs of age after birth. If infant does not latch, Mom instructed to offer more as tolerated.    Maternal Data    Feeding Mother's Current Feeding Choice: Breast Milk and Formula  LATCH Score                    Lactation Tools Discussed/Used Tools: Pump;Flanges Flange Size: 27 Breast pump type: Double-Electric Breast Pump Reason for Pumping: increase stimulation Pumping frequency: every 3 hrs for 15 minutes  Interventions Interventions: Breast feeding basics reviewed;DEBP;Expressed milk;Education;Coconut oil  Discharge Pump: Manual WIC Program: Yes  Consult Status Consult Status: Follow-up Date: 03/25/21 Follow-up type: In-patient    Terri Boultinghouse  Potts 03/24/2021, 7:12 PM

## 2021-03-24 NOTE — Lactation Note (Signed)
This note was copied from a baby's chart. Lactation Consultation Note  Patient Name: Terri Potts JHERD'E Date: 03/24/2021 Reason for consult: Follow-up assessment Age:21 hours   Mother bottle fed infant 2 hours ago. She is cradling infant at present. She reports that she has not began pumping yet. She reports that her mother is coming and she plans to pump when her mother comes so she can hold her infant.  Ask mother if she would like more assistance to latch infant , she answered yes., she reports that it hurts if she doesn't use the nipple shield.   Offered to place infant in football hold. Mother reports she doesn't like football hold. Infant was placed in cross cradle hold. Mother leaking milk from the right breast. Suggested we try the right breast . Reviewed use of the #20 nipple shield . Applied the #20 NS . Infant latched after several attempts. Taught mother how to do chin tug and roll top lip. Top lip very tight, unable to flip lip. Infant sustained latch  For 10 mins. Observed large amt of colostrum in NS and milk around areola.   Encouraged mother to pump after each feeding. Suggested every 2-3 hours for 15 mins.  Encouraged mother to supplement infant the rest of his feeding with a bottle,. Mother was given" Caring For Your Late Preterm Infant " handout and reviewed supplemental guidelines.    Maternal Data    Feeding Mother's Current Feeding Choice: Breast Milk and Formula Nipple Type: Extra Slow Flow  LATCH Score Latch: Grasps breast easily, tongue down, lips flanged, rhythmical sucking.  Audible Swallowing: Spontaneous and intermittent  Type of Nipple: Everted at rest and after stimulation  Comfort (Breast/Nipple): Soft / non-tender  Hold (Positioning): Assistance needed to correctly position infant at breast and maintain latch.  LATCH Score: 9   Lactation Tools Discussed/Used Tools: Nipple Shields Nipple shield size: 20  Interventions Interventions:  Assisted with latch;Skin to skin;Hand express;Breast compression;Adjust position;Support pillows;Position options;DEBP  Discharge    Consult Status Consult Status: Follow-up Date: 03/24/21 Follow-up type: In-patient    Stevan Born [facility] 03/24/2021, 12:10 PM

## 2021-03-24 NOTE — Progress Notes (Signed)
Patient is eating, ambulating, voiding.  Pain control is good.  Vitals:   03/23/21 1640 03/23/21 2040 03/24/21 0100 03/24/21 0510  BP: 114/81 (!) 139/96 118/74 116/77  Pulse: 73 (!) 51 (!) 59 60  Resp: 18 18 18 20   Temp: (!) 97.5 F (36.4 C) 97.7 F (36.5 C) 98.6 F (37 C) (!) 97.5 F (36.4 C)  TempSrc: Axillary Oral Oral Oral  SpO2: 100% 100% 100% 100%  Weight:      Height:        Fundus firm Perineum without swelling.  Lab Results  Component Value Date   WBC 9.5 03/24/2021   HGB 9.3 (L) 03/24/2021   HCT 27.7 (L) 03/24/2021   MCV 86.3 03/24/2021   PLT 128 (L) 03/24/2021    --/--/A POS (04/10 0135)/RI  A/P Post partum day 1.  Routine care.  Expect d/c routine.   Parents desires circumsision.  All risks, benefits and alternatives discussed with the mother.  01-01-2001

## 2021-03-24 NOTE — Anesthesia Postprocedure Evaluation (Signed)
Anesthesia Post Note  Patient: Terri Potts  Procedure(s) Performed: AN AD HOC LABOR EPIDURAL     Patient location during evaluation: Mother Baby Anesthesia Type: Epidural Level of consciousness: awake, awake and alert and oriented Pain management: pain level controlled Vital Signs Assessment: post-procedure vital signs reviewed and stable Respiratory status: spontaneous breathing, nonlabored ventilation and respiratory function stable Cardiovascular status: stable Postop Assessment: no headache, patient able to bend at knees, no apparent nausea or vomiting, no backache, able to ambulate and adequate PO intake Anesthetic complications: no   No complications documented.  Last Vitals:  Vitals:   03/24/21 0100 03/24/21 0510  BP: 118/74 116/77  Pulse: (!) 59 60  Resp: 18 20  Temp: 37 C (!) 36.4 C  SpO2: 100% 100%    Last Pain:  Vitals:   03/24/21 0715  TempSrc:   PainSc: 0-No pain   Pain Goal: Patients Stated Pain Goal: 2 (03/23/21 0229)                 Truitt Leep

## 2021-03-25 MED ORDER — IBUPROFEN 800 MG PO TABS: 800.0000 mg | ORAL_TABLET | Freq: Three times a day (TID) | ORAL | 0 refills | Status: DC

## 2021-03-25 NOTE — Discharge Summary (Signed)
Postpartum Discharge Summary     Patient Name: Terri Potts DOB: 14-Feb-2001 MRN: [id number]  Date of admission: 03/23/2021 Delivery date:03/23/2021  Delivering provider: Carrington Clamp  Date of discharge: 03/25/2021  Admitting diagnosis: Normal labor [O80, Z37.9] Intrauterine pregnancy: [redacted]w[redacted]d     Secondary diagnosis:  Active Problems:   Normal labor  Additional problems: None    Discharge diagnosis: Term Pregnancy Delivered                                              Post partum procedures:None Augmentation: Pitocin Complications: None  Hospital course: Induction of Labor With Vaginal Delivery   20 y.o. yo G1P1001 at [redacted]w[redacted]d was admitted to the hospital 03/23/2021 for induction of labor.  Indication for induction: PROM.  Patient had an uncomplicated labor course as follows: Membrane Rupture Time/Date: 1:00 AM ,03/23/2021   Delivery Method:Vaginal, Spontaneous  Episiotomy: None  Lacerations:  Perineal  Details of delivery can be found in separate delivery note.  Patient had a routine postpartum course. Patient is discharged home 03/25/21.  Newborn Data: Birth date:03/23/2021  Birth time:1:26 PM  Gender:Female  Living status:Living  Apgars:8 ,9  Weight:2625 g    Physical exam  Vitals:   03/24/21 0100 03/24/21 0510 03/24/21 2055 03/25/21 0545  BP: 118/74 116/77 133/90 129/80  Pulse: (!) 59 60 78 80  Resp: 18 20 18 18   Temp: 98.6 F (37 C) (!) 97.5 F (36.4 C) 98.2 F (36.8 C) 98.4 F (36.9 C)  TempSrc: Oral Oral Oral Oral  SpO2: 100% 100% 98% 100%  Weight:      Height:       General: alert, cooperative and no distress Lochia: appropriate Uterine Fundus: firm Incision: N/A DVT Evaluation: No evidence of DVT seen on physical exam. Labs: Lab Results  Component Value Date   WBC 9.5 03/24/2021   HGB 9.3 (L) 03/24/2021   HCT 27.7 (L) 03/24/2021   MCV 86.3 03/24/2021   PLT 128 (L) 03/24/2021   CMP Latest Ref Rng & Units 12/28/2018  Glucose 65 - 99 mg/dL 80   BUN 6 - 20 mg/dL 7  Creatinine 12/30/2018 - 8.52 mg/dL 7.78  Sodium 2.42 - 353 mmol/L 146(H)  Potassium 3.5 - 5.2 mmol/L 3.9  Chloride 96 - 106 mmol/L 108(H)  CO2 20 - 29 mmol/L 24  Calcium 8.7 - 10.2 mg/dL 9.7  Total Protein 6.5 - 8.1 g/dL -  Total Bilirubin 0.3 - 1.2 mg/dL -  Alkaline Phos 38 - 614 U/L -  AST 15 - 41 U/L -  ALT 0 - 44 U/L -   Edinburgh Score: No flowsheet data found.    After visit meds:  Allergies as of 03/25/2021      Reactions   Bee Venom Swelling   At site of bite      Medication List    STOP taking these medications   Blood Pressure Monitor Automat Devi     TAKE these medications   Gojji Weight Scale Misc 1 Device by Does not apply route daily as needed. To weight self daily as needed at home. ICD-10 code: O09.90   ibuprofen 800 MG tablet Commonly known as: ADVIL Take 1 tablet (800 mg total) by mouth 3 (three) times daily.   Prenatal Plus/Iron 27-1 MG Tabs Take 1 tablet by mouth daily.  Discharge home in stable condition Infant Feeding: Bottle Infant Disposition:home with mother Discharge instruction: per After Visit Summary and Postpartum booklet. Activity: Advance as tolerated. Pelvic rest for 6 weeks.  Diet: routine diet Postpartum Appointment:4 weeks  Future Appointments:No future appointments. Follow up Visit:  Follow-up Information    Carrington Clamp, MD Follow up in 4 week(s).   Specialty: Obstetrics and Gynecology Contact information: [address] [place] [address] [phone]                   03/25/2021 [facility] Lizabeth Leyden, MD

## 2021-03-25 NOTE — Progress Notes (Signed)
Patient is doing well.  She is ambulating, voiding, tolerating PO.  Pain control is good.  Lochia is appropriate  Vitals:   03/24/21 0100 03/24/21 0510 03/24/21 2055 03/25/21 0545  BP: 118/74 116/77 133/90 129/80  Pulse: (!) 59 60 78 80  Resp: 18 20 18 18   Temp: 98.6 F (37 C) (!) 97.5 F (36.4 C) 98.2 F (36.8 C) 98.4 F (36.9 C)  TempSrc: Oral Oral Oral Oral  SpO2: 100% 100% 98% 100%  Weight:      Height:        NAD Fundus firm Ext: trace edema bilaterally  Lab Results  Component Value Date   WBC 9.5 03/24/2021   HGB 9.3 (L) 03/24/2021   HCT 27.7 (L) 03/24/2021   MCV 86.3 03/24/2021   PLT 128 (L) 03/24/2021    --/--/A POS (04/10 0135)/RImmune  A/P 19 y.o. G1P1001 PPD#2 s/p TSVD. Routine care.   Meeting all goals.  Discharge to home today. 01-01-2001    [facility]

## 2021-03-25 NOTE — Social Work (Signed)
CSW received consult for hx of marijuana use.  Referral was screened out due to the following:  ~MOB had no documented substance use after initial prenatal visit/+UPT. ~MOB had no positive drug screens after initial prenatal visit/+UPT. ~Baby's UDS is negative.  CSW will monitor CDS results and make report to Child Protective Services if warranted. CSW identifies no additional needs or barriers to discharge at this time.   Manfred Arch, MSW, [employer] Clinical Social Work [employer] and [employer] [phone]

## 2022-05-19 ENCOUNTER — Ambulatory Visit (HOSPITAL_COMMUNITY)
Admission: RE | Admit: 2022-05-19 | Discharge: 2022-05-19 | Disposition: A | Discharge status: Home / Self Care | Payer: Medicaid Other | Source: Ambulatory Visit | Attending: Internal Medicine | Admitting: Internal Medicine

## 2022-05-19 ENCOUNTER — Encounter (HOSPITAL_COMMUNITY): Payer: Self-pay

## 2022-05-19 ENCOUNTER — Other Ambulatory Visit: Payer: Self-pay

## 2022-05-19 VITALS — BP 133/96 | HR 86 | Temp 98.3°F | Resp 18

## 2022-05-19 DIAGNOSIS — R051 Acute cough: Secondary | ICD-10-CM

## 2022-05-19 MED ORDER — CETIRIZINE HCL 10 MG PO TABS: 10.0000 mg | ORAL_TABLET | Freq: Every day | ORAL | 2 refills | Status: DC
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
## 2022-07-15 LAB — HM PAP SMEAR

## 2022-10-27 ENCOUNTER — Encounter (HOSPITAL_COMMUNITY): Payer: Self-pay

## 2022-10-27 ENCOUNTER — Emergency Department (HOSPITAL_COMMUNITY)
Admission: EM | Admit: 2022-10-27 | Discharge: 2022-10-27 | Disposition: A | Discharge status: Home / Self Care | Payer: Medicaid Other | Attending: Emergency Medicine | Admitting: Emergency Medicine

## 2022-10-27 DIAGNOSIS — M542 Cervicalgia: Secondary | ICD-10-CM | POA: Diagnosis not present

## 2022-10-27 DIAGNOSIS — Y92410 Unspecified street and highway as the place of occurrence of the external cause: Secondary | ICD-10-CM | POA: Diagnosis not present

## 2022-10-27 MED ORDER — NAPROXEN 500 MG PO TABS: 500.0000 mg | ORAL_TABLET | Freq: Two times a day (BID) | ORAL | 0 refills | Status: DC

## 2022-10-27 MED ORDER — METHOCARBAMOL 500 MG PO TABS: 500.0000 mg | ORAL_TABLET | Freq: Two times a day (BID) | ORAL | 0 refills | Status: DC
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
## 2023-03-22 ENCOUNTER — Institutional Professional Consult (permissible substitution): Payer: Medicaid Other | Admitting: Medical

## 2023-03-29 ENCOUNTER — Ambulatory Visit (INDEPENDENT_AMBULATORY_CARE_PROVIDER_SITE_OTHER): Payer: Medicaid Other | Admitting: Medical

## 2023-03-29 VITALS — BP 118/80 | HR 72 | Ht 65.0 in | Wt 150.2 lb

## 2023-03-29 DIAGNOSIS — M25561 Pain in right knee: Secondary | ICD-10-CM

## 2023-03-29 DIAGNOSIS — H9192 Unspecified hearing loss, left ear: Secondary | ICD-10-CM | POA: Diagnosis not present

## 2023-03-29 DIAGNOSIS — N926 Irregular menstruation, unspecified: Secondary | ICD-10-CM | POA: Insufficient documentation

## 2023-03-29 DIAGNOSIS — H1013 Acute atopic conjunctivitis, bilateral: Secondary | ICD-10-CM | POA: Insufficient documentation

## 2023-03-29 DIAGNOSIS — J301 Allergic rhinitis due to pollen: Secondary | ICD-10-CM | POA: Insufficient documentation

## 2023-03-29 DIAGNOSIS — M25562 Pain in left knee: Secondary | ICD-10-CM

## 2023-03-29 DIAGNOSIS — M21169 Varus deformity, not elsewhere classified, unspecified knee: Secondary | ICD-10-CM | POA: Insufficient documentation

## 2023-03-29 DIAGNOSIS — M21069 Valgus deformity, not elsewhere classified, unspecified knee: Secondary | ICD-10-CM

## 2023-03-29 DIAGNOSIS — G8929 Other chronic pain: Secondary | ICD-10-CM

## 2023-03-29 MED ORDER — CETIRIZINE HCL 10 MG PO TABS: 10.0000 mg | ORAL_TABLET | Freq: Every day | ORAL | 1 refills | Status: DC

## 2023-03-29 MED ORDER — OLOPATADINE HCL 0.2 % OP SOLN: 1.0000 [drp] | Freq: Every day | OPHTHALMIC | 5 refills | Status: DC

## 2023-03-29 NOTE — Progress Notes (Signed)
Subjective:  Terri Potts is a 22 y.o. female who presents for Chief Complaint  Patient presents with   get establish    Get establishe, having some swelling in knees been going on 3-4 years. Feet will fall asleep on her if he feet will be crossed for a few minutes, would like something for allergies as her allergies are bothering her Having some depression issues- PQ-9 abnormal     Was seen [facility].   She notes that she is having knee pains and swelling in both knee x 3- 4 years.   Mom and sister get similar.   Sister had to have a knee surgery.  Not sure if any family history of rheumatoid disease.  No other joint pains.   No specific prior injury trauma or fall.  No heavy sports in the past that she would attribute to knee pains.  Feels like left ear hearing is decreased.   Hard to hear at times.    Just came off menstrual cycle. Had cycle twice in January.   Saw gyn.  Has new script for hormone patch but a little afraid to use birth control.   Was on depo shot inpast, bled almost for an entire year.  Had biopsy this past week through gyn.  Has skin lesion of concern.  Awaitin results.  No other aggravating or relieving factors.    No other c/o.  The following portions of the patient's history were reviewed and updated as appropriate: allergies, current medications, past family history, past medical history, past social history, past surgical history and problem list.  ROS Otherwise as in subjective above  Objective: BP 118/80   Pulse 72   Ht 5\' 5"  (1.651 m)   Wt 150 lb 3.2 oz (68.1 kg)   BMI 24.99 kg/m   General appearance: alert, no distress, well developed, well nourished HEENT: normocephalic, sclerae anicteric, conjunctiva pink and moist, moderate cerumen bilat, but partial TM visualized bilat is normal.   Nares patent, no discharge or erythema, pharynx normal Oral cavity: MMM, no lesions Neck: supple, no lymphadenopathy, no thyromegaly, no masses Heart: RRR,  normal S1, S2, no murmurs Lungs: CTA bilaterally, no wheezes, rhonchi, or rales Bilat knees with mild joint line tendnerss, but no swelling, no laxity, no deformity, rest of leg exam unremarkable.  Tattoo right upper anterior thigh. Pulses: 2+ radial pulses, 2+ pedal pulses, normal cap refill Ext: no edema   Assessment: Encounter Diagnoses  Name Primary?   Chronic pain of both knees Yes   Hearing decreased, left    Irregular periods    Allergic rhinitis due to pollen, unspecified seasonality    Allergic conjunctivitis of both eyes    Knock knee, unspecified laterality      Plan: Chronic pain of both knees Knee pain and swelling Consider baseline x-rays.  You declined today we can do this in the future if desired I would use one of the following recommendations: Consider buying over-the-counter arch supports and wearing them in your shoes Consider wearing a different shoe that has thicker arch support in general such as a shoe for over pronators or balance or constraining type shoe.  Most brands have models or shoes for over pronate or has a beefier arch support Another option is to go to the [employer] and let them fit you for special arch supports And finally, if you would like a referral to a foot doctor for a specialist to check them out and give you  advice we can do that as well.  Just let me know.  Hearing decreased, impacted cerumen Discussed findings.  Discussed risk/benefits of procedure and patient agrees to procedure. Successfully used warm water lavage to remove impacted cerumen from bilat ear canal. Patient tolerated procedure well. Advised they avoid using any cotton swabs or other devices to clean the ear canals.  Use basic hygiene as discussed.  Follow up prn.   Irregular periods - saw gyn last week.  I recommend you call gynecology to inquire about your results from the biopsy.  Go ahead start your birth control patch given your concerns for irregular periods.   This was prescribed by gynecology.  Of note last menstrual period just ended this weekend  Allergic rhinitis and allergic conjunctivitis-begin cetirizine oral tablet at night and allergy eyedrops as below  Knock knees-we will start with foot inserts/shoe inserts to see if this helps with some of her issues  She had other concerns today but too many to discuss in 1 visit.  Return at your convenience   Terri Potts was seen today for get establish.  Diagnoses and all orders for this visit:  Chronic pain of both knees  Hearing decreased, left  Irregular periods  Allergic rhinitis due to pollen, unspecified seasonality  Allergic conjunctivitis of both eyes  Knock knee, unspecified laterality  Other orders -     cetirizine (ZYRTEC) 10 MG tablet; Take 1 tablet (10 mg total) by mouth at bedtime. -     Olopatadine HCl (PATADAY) 0.2 % SOLN; Apply 1 drop to eye daily.    Follow up: soon for recheck

## 2023-03-29 NOTE — Patient Instructions (Addendum)
Knee pain and swelling Consider baseline x-rays.  You declined today we can do this in the future if desired I would use one of the following recommendations: Consider buying over-the-counter arch supports and wearing them in your shoes Consider wearing a different shoe that has thicker arch support in general such as a shoe for over pronators or balance or crosstraining type shoe.  Most brands have models or shoes for overpronate or has a beefier arch support Another option is to go to the [employer] and let them fit you for special arch supports And finally, if you would like a referral to a foot doctor for a specialist to check them out and give you advice we can do that as well.  Just let me know.   Go ahead and start the birth control patch to see if can help periods.  It may take a few months to get stable   Begin allergy tablet at bedtime, eye drop in the day   Return soon for well visit or follow up on other concerns.

## 2023-05-17 ENCOUNTER — Ambulatory Visit (INDEPENDENT_AMBULATORY_CARE_PROVIDER_SITE_OTHER): Payer: Medicaid Other | Admitting: Medical

## 2023-05-17 VITALS — BP 120/80 | HR 78 | Wt 148.8 lb

## 2023-05-17 DIAGNOSIS — L309 Dermatitis, unspecified: Secondary | ICD-10-CM | POA: Diagnosis not present

## 2023-05-17 DIAGNOSIS — Z113 Encounter for screening for infections with a predominantly sexual mode of transmission: Secondary | ICD-10-CM | POA: Diagnosis not present

## 2023-05-17 DIAGNOSIS — N898 Other specified noninflammatory disorders of vagina: Secondary | ICD-10-CM

## 2023-05-17 DIAGNOSIS — R21 Rash and other nonspecific skin eruption: Secondary | ICD-10-CM | POA: Diagnosis not present

## 2023-05-17 LAB — POCT WET PREP (WET MOUNT)
Clue Cells Wet Prep Whiff POC: NEGATIVE
Trichomonas Wet Prep HPF POC: ABSENT

## 2023-05-17 MED ORDER — TRIAMCINOLONE ACETONIDE 0.1 % EX CREA: 1.0000 | TOPICAL_CREAM | Freq: Two times a day (BID) | CUTANEOUS | 0 refills | Status: DC

## 2023-05-17 MED ORDER — FLUCONAZOLE 150 MG PO TABS: 150.0000 mg | ORAL_TABLET | ORAL | 0 refills | Status: DC

## 2023-05-17 MED ORDER — METRONIDAZOLE 500 MG PO TABS: 500.0000 mg | ORAL_TABLET | Freq: Three times a day (TID) | ORAL | 0 refills | Status: AC

## 2023-05-17 NOTE — Progress Notes (Signed)
Subjective:  Terri Potts is a 22 y.o. female who presents for Chief Complaint  Patient presents with   other    STD testing and has place on chest that is itchy, place on thigh is not itchy had eczema when she was younger,  she saw [facility] in April they told her she had a yeast infection, she has had some vaginal dryness, and did have sex last week so wanted some STD testing done.      Here for concerns.  Would like STD screen.  Has some whitish discharge, some thicker sometimes not.  Had discharge for past week.  Had yeast in April per gyn, and although she used the medication, but symptoms never resolved.   Had 2 different medicaiton prescribed at that time.  Not interested in OCP as she has not tolerated hormone contraception prior  New partner recently, sexually active recently once but had not had sexual activity since last year.  Last STD labs last year.    No recent antibiotic use.   Uses dove soap, but has tried some different soaps from time to time.  Has some patches of itchy skin on left thigh, in between breasts and right leg just below knee laterally.  Uses Vaseline at night for routine skin treatment  No other aggravating or relieving factors.    No other c/o.  Past Medical History:  Diagnosis Date   Anxiety    Eczema    History of anemia    Seasonal allergies    Supervision of normal first pregnancy, antepartum 09/02/2020    Nursing Staff Provider Office Location  [facility] Dating   early [place] Language  English Anatomy [place]   ordered Flu Vaccine   Genetic Screen  NIPS:   AFP:     TDaP Vaccine    Hgb A1C or  GTT Early A1c-  Third trimester  COVID Vaccine    LAB RESULTS  Rhogam   Blood Type    Feeding Plan Breast Antibody   Contraception Undecided Rubella   Circumcision Yes RPR Non Reactive (07/26 1611)  Pediatrician  In   Syncope and collapse    Current Outpatient Medications on File Prior to Visit  Medication Sig Dispense Refill   cetirizine (ZYRTEC) 10 MG  tablet Take 1 tablet (10 mg total) by mouth at bedtime. 90 tablet 1   cetirizine (ZYRTEC) 10 MG tablet Take 1 tablet (10 mg total) by mouth daily. (Patient not taking: Reported on 03/29/2023) 30 tablet 2   Olopatadine HCl (PATADAY) 0.2 % SOLN Apply 1 drop to eye daily. (Patient not taking: Reported on 05/17/2023) 2.5 mL 5   No current facility-administered medications on file prior to visit.    The following portions of the patient's history were reviewed and updated as appropriate: allergies, current medications, past family history, past medical history, past social history, past surgical history and problem list.  ROS Otherwise as in subjective above   Objective: BP 120/80   Pulse 78   Wt 148 lb 12.8 oz (67.5 kg)   BMI 24.76 kg/m   General appearance: alert, no distress, well developed, well nourished Skin: Small patch of brownish rough skin in between the breast superiorly but not down the sternum or under the breast Gyn: Normal external genitalia without lesions, vagina with normal mucosa, cervix without lesions, no cervical motion tenderness, moderate amount of whitish-yellow vaginal discharge.  Uterus and adnexa not enlarged, nontender, no masses.  Swabs taken, exam chaperoned by nurse. Rectal: Anus  normal appearing    Assessment: Encounter Diagnoses  Name Primary?   Vaginal discharge Yes   Screen for STD (sexually transmitted disease)    Rash    Eczema, unspecified type      Plan: Vaginal discharge-wet prep shows some clue cells and yeast.  Begin round of metronidazole plus Diflucan as below.  STD screening-await lab results.  Advised condom use.  Consider contraception but she declines at this time  Eczema-begin triamcinolone for worse flareup of the legs and between the breast for the next week, do not use on face, avoid repeated use after a week or so at a time.  Continue Vaseline at night or other moisturizing lotion daily   Graciela was seen today for  other.  Diagnoses and all orders for this visit:  Vaginal discharge  Screen for STD (sexually transmitted disease)  Rash  Eczema, unspecified type    Follow up: pending labs

## 2023-05-18 NOTE — Progress Notes (Signed)
Results sent through MyChart

## 2023-05-19 ENCOUNTER — Ambulatory Visit: Payer: Medicaid Other | Admitting: Medical

## 2023-05-21 ENCOUNTER — Encounter: Payer: Self-pay | Admitting: Internal Medicine

## 2023-05-21 ENCOUNTER — Other Ambulatory Visit: Payer: Self-pay | Admitting: Internal Medicine

## 2023-05-21 ENCOUNTER — Other Ambulatory Visit: Payer: Self-pay | Admitting: Medical

## 2023-05-21 DIAGNOSIS — A749 Chlamydial infection, unspecified: Secondary | ICD-10-CM

## 2023-05-21 DIAGNOSIS — Z8619 Personal history of other infectious and parasitic diseases: Secondary | ICD-10-CM | POA: Insufficient documentation

## 2023-05-21 LAB — HEPATITIS B SURFACE ANTIGEN: Hepatitis B Surface Ag: NEGATIVE

## 2023-05-21 LAB — RPR+HIV+GC+CT PANEL
Chlamydia trachomatis, NAA: POSITIVE — AB
HIV Screen 4th Generation wRfx: NONREACTIVE
Neisseria Gonorrhoeae by PCR: NEGATIVE
RPR Ser Ql: NONREACTIVE

## 2023-05-21 LAB — HEPATITIS C ANTIBODY: Hep C Virus Ab: NONREACTIVE

## 2023-05-21 MED ORDER — AZITHROMYCIN 500 MG PO TABS: 1000.0000 mg | ORAL_TABLET | Freq: Once | ORAL | 0 refills | Status: AC

## 2023-05-21 NOTE — Progress Notes (Signed)
Your results unfortunately show that you are positive for chlamydia.  I sent a medication called azithromycin to your pharmacy to start today.  I recommend you refrain from sexual activity for the next week.  I recommend you use condoms.  I do recommend repeat testing in 2 weeks or so to make sure the chlamydia has fully been treated  Results also have to be sent to the health department so expect a phone call possibly from them as well

## 2023-06-21 ENCOUNTER — Ambulatory Visit: Payer: Medicaid Other | Admitting: Medical

## 2023-06-30 ENCOUNTER — Ambulatory Visit: Payer: Medicaid Other | Admitting: Medical

## 2023-07-06 ENCOUNTER — Ambulatory Visit (INDEPENDENT_AMBULATORY_CARE_PROVIDER_SITE_OTHER): Payer: Medicaid Other | Admitting: Medical

## 2023-07-06 VITALS — BP 120/74 | HR 64 | Wt 147.0 lb

## 2023-07-06 DIAGNOSIS — R41840 Attention and concentration deficit: Secondary | ICD-10-CM

## 2023-07-06 DIAGNOSIS — Z8619 Personal history of other infectious and parasitic diseases: Secondary | ICD-10-CM | POA: Diagnosis not present

## 2023-07-06 DIAGNOSIS — R239 Unspecified skin changes: Secondary | ICD-10-CM

## 2023-07-06 DIAGNOSIS — R413 Other amnesia: Secondary | ICD-10-CM | POA: Diagnosis not present

## 2023-07-06 DIAGNOSIS — F439 Reaction to severe stress, unspecified: Secondary | ICD-10-CM | POA: Diagnosis not present

## 2023-07-06 DIAGNOSIS — F909 Attention-deficit hyperactivity disorder, unspecified type: Secondary | ICD-10-CM | POA: Diagnosis not present

## 2023-07-06 LAB — CBC
Hematocrit: 39.1 % (ref 34.0–46.6)
Hemoglobin: 12.3 g/dL (ref 11.1–15.9)
Platelets: 234 10*3/uL (ref 150–450)
RDW: 12.9 % (ref 11.7–15.4)
WBC: 4.4 10*3/uL (ref 3.4–10.8)

## 2023-07-06 LAB — BASIC METABOLIC PANEL

## 2023-07-06 LAB — VITAMIN B12

## 2023-07-06 LAB — TSH

## 2023-07-06 LAB — VITAMIN D 25 HYDROXY (VIT D DEFICIENCY, FRACTURES)

## 2023-07-06 NOTE — Progress Notes (Signed)
Subjective:  Terri Potts is a 22 y.o. female who presents for Chief Complaint  Patient presents with   Consult    Feels like she is forgetting a lot of things, been going on since she was 7 or 8 but getting worse. Having some spots on feet and feels like something comes out of feet     Here for concerns about memory, forgetting.  She notes problems with forgetfulness for years.  Even her childhood years she feels like she had problems with forgetting, problems with hyperactivity and attention.  She notes problems with prior schoolwork and work on jobs due to forgetfulness or problems of the same symptoms.  When having conversations can forget what they were talking about in the conversation easily.  She loses stuff all the time, such as keys or other.   Has trouble with focus.    Others have commented on her forgetfulness such as mom.  But she also notes that her mom can be toxic at times and has even made comments about how she is a bad mother. Tyese does not know she is a baby for the lack of confidence often.  She also worries that she cannot support herself financially.  She is living currently with grandparents, x 1 years.  She does get food stamps and assistance with her child's needs.  Grew up with mom and siblings. There was some yelling and toxic environment at times.    Graduated high school.  Was working at [employer], fired in May due to absence due to son's health and his visits.  Looking for work currently.  Worked at [employer] for 5 years.  During her time at the prior job, she noted issues with forgetting, but never got wrote up.  She has thought about going back to school to get some training and education  No prior psychiatry.  No prior counseling  Also has skin concerns of feet and fingers.  She notes some brown spots on the bottom of her foot  No other aggravating or relieving factors.    No other c/o.  Past Medical History:  Diagnosis Date   Anxiety    Eczema     History of anemia    Seasonal allergies    Supervision of normal first pregnancy, antepartum 09/02/2020    Nursing Staff Provider Office Location  [facility] Dating   early [place] Language  English Anatomy [place]   ordered Flu Vaccine   Genetic Screen  NIPS:   AFP:     TDaP Vaccine    Hgb A1C or  GTT Early A1c-  Third trimester  COVID Vaccine    LAB RESULTS  Rhogam   Blood Type    Feeding Plan Breast Antibody   Contraception Undecided Rubella   Circumcision Yes RPR Non Reactive (07/26 1611)  Pediatrician  In   Syncope and collapse    Current Outpatient Medications on File Prior to Visit  Medication Sig Dispense Refill   cetirizine (ZYRTEC) 10 MG tablet Take 1 tablet (10 mg total) by mouth at bedtime. 90 tablet 1   fluconazole (DIFLUCAN) 150 MG tablet Take 1 tablet (150 mg total) by mouth once a week. (Patient not taking: Reported on 07/06/2023) 3 tablet 0   No current facility-administered medications on file prior to visit.     The following portions of the patient's history were reviewed and updated as appropriate: allergies, current medications, past family history, past medical history, past social history, past surgical history and  problem list.  ROS Otherwise as in subjective above     Objective: BP 120/74   Pulse 64   Wt 147 lb (66.7 kg)   BMI 24.46 kg/m   Wt Readings from Last 3 Encounters:  07/06/23 147 lb (66.7 kg)  05/17/23 148 lb 12.8 oz (67.5 kg)  03/29/23 150 lb 3.2 oz (68.1 kg)    General appearance: alert, no distress, well developed, well nourished HEENT: normocephalic, sclerae anicteric, conjunctiva pink and moist, TMs pearly, nares patent, no discharge or erythema, pharynx normal Oral cavity: MMM, no lesions Neck: supple, no lymphadenopathy, no thyromegaly, no masses Heart: RRR, normal S1, S2, no murmurs Lungs: CTA bilaterally, no wheezes, rhonchi, or rales Pulses: 2+ radial pulses, 2+ pedal pulses, normal cap refill Ext: no edema Neuro: CN II through XII intact,  nonfocal exam Psych: Pleasant, good eye contact, answers question appropriate, tearful at times Skin: Nonspecific faint brownish 2 to 3 mm flat lesions on the sole of her feet, 1 small indented area of the volar distal foot along fourth metatarsal suggestive of may be recent plantar wart    Assessment: Encounter Diagnoses  Name Primary?   Memory deficit Yes   Attention deficit    Hyperactivity    History of chlamydia    Stress    Skin change      Plan: We discussed her symptoms and concerns  We will check some labs today to evaluate for possible sources of changes in mood or attention but I strongly suspect her issues are more psychological and emotional in nature  MMSE today score of 28/29 PHQ-9 23 ADHD questionnaire + for both inattention and hyperactivity  I recommend she do some baseline counseling or possibly even a psychological evaluation.  I think she could benefit from some counseling.  She may even have some components of ADHD.  She had chlamydia back in June.  Recheck labs now that she has had treatment in her partner who reportedly had treatment as well  Brehanna was seen today for consult.  Diagnoses and all orders for this visit:  Memory deficit -     TSH -     Vitamin B12 -     VITAMIN D 25 Hydroxy (Vit-D Deficiency, Fractures) -     CBC -     Basic metabolic panel  Attention deficit -     TSH -     Vitamin B12 -     VITAMIN D 25 Hydroxy (Vit-D Deficiency, Fractures) -     CBC -     Basic metabolic panel -     Ambulatory referral to Psychiatry  Hyperactivity -     TSH -     Vitamin B12 -     VITAMIN D 25 Hydroxy (Vit-D Deficiency, Fractures) -     CBC -     Basic metabolic panel -     Ambulatory referral to Psychiatry  History of chlamydia -     GC/Chlamydia Probe Amp([facility])  Stress -     Ambulatory referral to Psychiatry  Skin change    Follow up: pending labs

## 2023-07-07 ENCOUNTER — Other Ambulatory Visit: Payer: Self-pay | Admitting: Medical

## 2023-07-07 LAB — BASIC METABOLIC PANEL
BUN/Creatinine Ratio: 10 (ref 9–23)
CO2: 22 mmol/L (ref 20–29)
Chloride: 108 mmol/L — ABNORMAL HIGH (ref 96–106)
Creatinine, Ser: 0.69 mg/dL (ref 0.57–1.00)
Glucose: 84 mg/dL (ref 70–99)
Potassium: 3.3 mmol/L — ABNORMAL LOW (ref 3.5–5.2)
Sodium: 143 mmol/L (ref 134–144)
eGFR: 125 mL/min/{1.73_m2} (ref >59)

## 2023-07-07 LAB — CBC
MCH: 27.3 pg (ref 26.6–33.0)
MCHC: 31.5 g/dL (ref 31.5–35.7)
MCV: 87 fL (ref 79–97)
RBC: 4.51 x10E6/uL (ref 3.77–5.28)

## 2023-07-07 MED ORDER — VITAMIN D (ERGOCALCIFEROL) 1.25 MG (50000 UNIT) PO CAPS: 50000.0000 [IU] | ORAL_CAPSULE | ORAL | 3 refills | Status: DC

## 2023-07-07 MED ORDER — PRENATAL/IRON PO TABS: 1.0000 | ORAL_TABLET | Freq: Every day | ORAL | 1 refills | Status: DC

## 2023-07-07 NOTE — Progress Notes (Signed)
Results sent through MyChart

## 2023-07-08 ENCOUNTER — Telehealth: Payer: Self-pay | Admitting: Medical

## 2023-07-08 LAB — GC/CHLAMYDIA PROBE AMP
Chlamydia trachomatis, NAA: NEGATIVE
Neisseria Gonorrhoeae by PCR: NEGATIVE

## 2023-07-08 NOTE — Telephone Encounter (Signed)
Patient requested call back, she has questions about lab results

## 2023-07-09 NOTE — Progress Notes (Signed)
Results sent through MyChart

## 2023-07-13 ENCOUNTER — Ambulatory Visit: Payer: Medicaid Other | Admitting: Medical

## 2023-07-22 ENCOUNTER — Ambulatory Visit: Payer: Medicaid Other | Admitting: Family Medicine

## 2023-07-26 ENCOUNTER — Ambulatory Visit: Payer: Medicaid Other | Admitting: Family Medicine

## 2023-08-04 ENCOUNTER — Ambulatory Visit (INDEPENDENT_AMBULATORY_CARE_PROVIDER_SITE_OTHER): Payer: Medicaid Other | Admitting: Medical

## 2023-08-04 VITALS — BP 120/80 | HR 88 | Wt 150.8 lb

## 2023-08-04 DIAGNOSIS — L309 Dermatitis, unspecified: Secondary | ICD-10-CM | POA: Diagnosis not present

## 2023-08-04 DIAGNOSIS — M2141 Flat foot [pes planus] (acquired), right foot: Secondary | ICD-10-CM | POA: Diagnosis not present

## 2023-08-04 DIAGNOSIS — L84 Corns and callosities: Secondary | ICD-10-CM | POA: Diagnosis not present

## 2023-08-04 DIAGNOSIS — M2142 Flat foot [pes planus] (acquired), left foot: Secondary | ICD-10-CM | POA: Diagnosis not present

## 2023-08-04 NOTE — Patient Instructions (Addendum)
Eczema Use a daily moisturizing lotion such as Aquaphor, Lubriderm, Cetaphil, shea butter or cocoa butter  For worse irritation or rash you can use your triamcinolone cream twice daily for 1-1.5 week at a time  Callous of foot Soak feet in bath or epsom salt bath 20 minutes a few times per week Use pumice stone to sand down callous, but don't be so rough that you cause a sore or wound   If any signs of plantar wart, you can try using compound W OTC instead  Decreased foot arches Consider wearing shoe arch insert  Or consider not wearing crocs currently given the callous pattern on your feet   callous    Plantar wart

## 2023-08-04 NOTE — Progress Notes (Signed)
Subjective:  Terri Potts is a 22 y.o. female who presents for Chief Complaint  Patient presents with   ezcema    Ezcema on face and head Has callus on both feet and thinks she needs to see a foot specialist     Here for callus on feet, hurt some, mainly left foot.  No prior treatment.  Having some eczema or itching on neck and forehead.   Has hx/o eczema on arms. Using some oil currently.  Recently had yellow poop but that resolve. That lasted 2 weeks, diarrhea, but it resolved.  No sick contacts. No other household contacts had similar.  No other aggravating or relieving factors.    No other c/o.  The following portions of the patient's history were reviewed and updated as appropriate: allergies, current medications, past family history, past medical history, past social history, past surgical history and problem list.  ROS Otherwise as in subjective above  Objective: BP 120/80   Pulse 88   Wt 150 lb 12.8 oz (68.4 kg)   BMI 25.09 kg/m   General appearance: alert, no distress, well developed, well nourished Skin: mild dry skin of neck anteriorly, otherwise neck and face unremarkable Bilat volar feet over 5th MTP bilat with callous Feet neurovascularly intact    Assessment: Encounter Diagnoses  Name Primary?   Callus of foot Yes   Pes planus of both feet    Eczema, unspecified type      Plan: Discussed concerns, symptoms, recommendations as below.  Patient Instructions  Eczema Use a daily moisturizing lotion such as Aquaphor, Lubriderm, Cetaphil, shea butter or cocoa butter  For worse irritation or rash you can use your triamcinolone cream twice daily for 1-1.5 week at a time  Callous of foot Soak feet in bath or epsom salt bath 20 minutes a few times per week Use pumice stone to sand down callous, but don't be so rough that you cause a sore or wound   If any signs of plantar wart, you can try using compound W OTC instead  Decreased foot arches Consider  wearing shoe arch insert  Or consider not wearing crocs currently given the callous pattern on your feet   callous    Plantar wart     [name] was seen today for ezcema.  Diagnoses and all orders for this visit:  Callus of foot  Pes planus of both feet  Eczema, unspecified type    Follow up: prn

## 2023-08-17 ENCOUNTER — Encounter: Payer: Self-pay | Admitting: Internal Medicine

## 2023-08-18 ENCOUNTER — Encounter: Payer: Self-pay | Admitting: Internal Medicine

## 2023-08-18 ENCOUNTER — Encounter (HOSPITAL_COMMUNITY): Payer: Self-pay

## 2023-08-20 ENCOUNTER — Ambulatory Visit (HOSPITAL_COMMUNITY): Payer: Medicaid Other | Admitting: Clinical

## 2023-08-23 ENCOUNTER — Telehealth: Payer: Self-pay | Admitting: *Deleted

## 2023-08-23 ENCOUNTER — Encounter: Payer: Medicaid Other | Admitting: Medical

## 2023-08-23 DIAGNOSIS — Z0000 Encounter for general adult medical examination without abnormal findings: Secondary | ICD-10-CM | POA: Insufficient documentation

## 2023-08-23 NOTE — Telephone Encounter (Signed)
This patient no showed for their appointment today.Which of the following is necessary for this patient.   A) No follow-up necessary   B) Follow-up urgent. Locate Patient Immediately.   C) Follow-up necessary. Contact patient and Schedule visit in ____ Days.   D) Follow-up Advised. Contact patient and Schedule visit in ____ Days.   E) Please Send no show letter to patient. Charge no show fee if no show was a CPE.   

## 2023-08-23 NOTE — Progress Notes (Deleted)
Vit D Hearing?  Cmet Vit d Lipid Cbc?  OCP?  7/24 refer to [facility]?

## 2023-08-30 ENCOUNTER — Other Ambulatory Visit: Payer: Self-pay | Admitting: Medical

## 2023-08-30 ENCOUNTER — Telehealth: Payer: Self-pay | Admitting: Medical

## 2023-08-30 DIAGNOSIS — Z111 Encounter for screening for respiratory tuberculosis: Secondary | ICD-10-CM

## 2023-08-30 NOTE — Telephone Encounter (Signed)
Pt wants to come in for a blood tb test is this ok?

## 2023-09-07 ENCOUNTER — Ambulatory Visit (INDEPENDENT_AMBULATORY_CARE_PROVIDER_SITE_OTHER): Payer: Medicaid Other | Admitting: Student

## 2023-09-07 ENCOUNTER — Encounter (HOSPITAL_COMMUNITY): Payer: Self-pay | Admitting: Student

## 2023-09-07 VITALS — BP 127/80 | HR 72 | Ht 64.0 in | Wt 160.2 lb

## 2023-09-07 DIAGNOSIS — F331 Major depressive disorder, recurrent, moderate: Secondary | ICD-10-CM | POA: Diagnosis not present

## 2023-09-07 DIAGNOSIS — F4310 Post-traumatic stress disorder, unspecified: Secondary | ICD-10-CM

## 2023-09-07 MED ORDER — PRAZOSIN HCL 1 MG PO CAPS: 1.0000 mg | ORAL_CAPSULE | Freq: Every day | ORAL | 1 refills | Status: DC

## 2023-09-07 MED ORDER — SERTRALINE HCL 25 MG PO TABS: 25.0000 mg | ORAL_TABLET | Freq: Every day | ORAL | 0 refills | Status: DC

## 2023-09-07 NOTE — Patient Instructions (Signed)
[facility]  URGENT CARE: Open 24 hours per day for acute and/or urgent behavioral health concerns.   OUTPATIENT Walk-in information:  Please note, all walk-ins are first come & first serve, with limited number of availability. Therapist for therapy:  Monday & Wednesdays: Please ARRIVE at 7:15 AM for registration Will START at 8:00 AM Every 1st & 2nd Friday of the month: Please ARRIVE at 10:15 AM for registration Will START at 1 PM - 5 PM Psychiatrist for medication management: Monday - Friday:  Please ARRIVE at 7:15 AM for registration Will START at 8:00 AM       Regretfully, due to limited availability, please be aware that you may not been seen on the same day as walk-in. Please consider making an appoint or try again. Thank you for your patience and understanding.  [facility] [address] [place], [place] [address] [phone]  New patients are seen at their walk-in clinic. Walk-in hours are Monday - Friday from 8:30 am - 12:00 pm, and from 1:00 pm - 2:30 pm.   Walk-in patients are seen on a first come, first served basis, so try to arrive as early as possible for the best chance of being seen the same day.
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
## 2023-09-08 ENCOUNTER — Other Ambulatory Visit: Payer: Medicaid Other

## 2023-09-08 ENCOUNTER — Encounter (HOSPITAL_COMMUNITY): Payer: Self-pay | Admitting: Student

## 2023-09-08 DIAGNOSIS — F331 Major depressive disorder, recurrent, moderate: Secondary | ICD-10-CM | POA: Insufficient documentation

## 2023-09-08 DIAGNOSIS — Z111 Encounter for screening for respiratory tuberculosis: Secondary | ICD-10-CM

## 2023-09-08 DIAGNOSIS — F4310 Post-traumatic stress disorder, unspecified: Secondary | ICD-10-CM | POA: Insufficient documentation

## 2023-09-09 LAB — QUANTIFERON-TB GOLD PLUS: QuantiFERON-TB Gold Plus: NEGATIVE

## 2023-09-13 NOTE — Progress Notes (Signed)
Results sent through MyChart

## 2023-10-12 ENCOUNTER — Encounter (HOSPITAL_COMMUNITY): Payer: Medicaid Other | Admitting: Student

## 2023-10-25 ENCOUNTER — Ambulatory Visit (HOSPITAL_COMMUNITY): Payer: Medicaid Other | Admitting: Clinical

## 2023-10-26 ENCOUNTER — Encounter (HOSPITAL_COMMUNITY): Payer: Medicaid Other | Admitting: Student

## 2023-10-28 ENCOUNTER — Encounter: Payer: Self-pay | Admitting: Medical

## 2023-12-02 ENCOUNTER — Ambulatory Visit (INDEPENDENT_AMBULATORY_CARE_PROVIDER_SITE_OTHER): Payer: Medicaid Other | Admitting: Student

## 2023-12-02 DIAGNOSIS — Z5941 Food insecurity: Secondary | ICD-10-CM | POA: Diagnosis not present

## 2023-12-02 DIAGNOSIS — F1220 Cannabis dependence, uncomplicated: Secondary | ICD-10-CM

## 2023-12-02 DIAGNOSIS — F4310 Post-traumatic stress disorder, unspecified: Secondary | ICD-10-CM

## 2023-12-02 DIAGNOSIS — F331 Major depressive disorder, recurrent, moderate: Secondary | ICD-10-CM

## 2023-12-02 DIAGNOSIS — Z59819 Housing instability, housed unspecified: Secondary | ICD-10-CM | POA: Diagnosis not present

## 2023-12-02 MED ORDER — MIRTAZAPINE 7.5 MG PO TABS: 7.5000 mg | ORAL_TABLET | Freq: Every day | ORAL | 1 refills | Status: AC
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
## 2023-12-10 ENCOUNTER — Telehealth: Payer: Self-pay | Admitting: *Deleted

## 2023-12-10 NOTE — Progress Notes (Signed)
Complex Care Management Note Care Guide Note  12/10/2023 Name: Terri Potts MRN: [id number] DOB: 2001-03-24   Complex Care Management Outreach Attempts: An unsuccessful telephone outreach was attempted today to offer the patient information about available complex care management services.  Follow Up Plan:  Additional outreach attempts will be made to offer the patient complex care management information and services.   Encounter Outcome:  No Answer  [facility]  Population Health Careguide  Direct Dial: [phone] Website: [email or web address]

## 2023-12-13 ENCOUNTER — Telehealth: Payer: Self-pay | Admitting: *Deleted

## 2023-12-13 NOTE — Progress Notes (Signed)
Complex Care Management Note Care Guide Note  12/13/2023 Name: Terri Potts MRN: [id number] DOB: 12/29/2000   Complex Care Management Outreach Attempts: A second unsuccessful outreach was attempted today to offer the patient with information about available complex care management services.  Follow Up Plan:  Additional outreach attempts will be made to offer the patient complex care management information and services.   Encounter Outcome:  Patient Refused  [facility] Boo[facility]  Population Health Careguide  Direct Dial: [phone] Website: [email or web address]

## 2023-12-16 ENCOUNTER — Telehealth: Payer: Self-pay | Admitting: *Deleted

## 2023-12-16 NOTE — Progress Notes (Signed)
  Complex Care Management Note Care Guide Note  12/16/2023 Name: Terri Potts MRN: [id number] DOB: 2001-03-10   Complex Care Management Outreach Attempts: A second unsuccessful outreach was attempted today to offer the patient with information about available complex care management services.  Follow Up Plan:  Will call back from 1 to 32 pm today Encounter Outcome:  Patient Refused  Asencion Andi [facility]  Population Health Careguide  Direct Dial: [phone] Website: [email or web address]

## 2023-12-20 ENCOUNTER — Encounter: Payer: Self-pay | Admitting: Medical

## 2023-12-20 ENCOUNTER — Telehealth: Payer: Medicaid Other | Admitting: Medical

## 2023-12-20 VITALS — Ht 64.5 in | Wt 168.0 lb

## 2023-12-20 DIAGNOSIS — R051 Acute cough: Secondary | ICD-10-CM

## 2023-12-20 DIAGNOSIS — R52 Pain, unspecified: Secondary | ICD-10-CM | POA: Diagnosis not present

## 2023-12-20 DIAGNOSIS — R0602 Shortness of breath: Secondary | ICD-10-CM | POA: Diagnosis not present

## 2023-12-20 DIAGNOSIS — R350 Frequency of micturition: Secondary | ICD-10-CM

## 2023-12-20 DIAGNOSIS — J988 Other specified respiratory disorders: Secondary | ICD-10-CM

## 2023-12-20 MED ORDER — ALBUTEROL SULFATE HFA 108 (90 BASE) MCG/ACT IN AERS: 2.0000 | INHALATION_SPRAY | Freq: Four times a day (QID) | RESPIRATORY_TRACT | 0 refills | Status: DC | PRN

## 2023-12-20 NOTE — Progress Notes (Signed)
 Subjective:     Patient ID: Terri Potts, female   DOB: 18-Jan-2001, 23 y.o.   MRN: [id number]  This visit type was conducted due to national recommendations for restrictions regarding the COVID-19 Pandemic (e.g. social distancing) in an effort to limit this patient's exposure and mitigate transmission in our community.  Due to their co-morbid illnesses, this patient is at least at moderate risk for complications without adequate follow up.  This format is felt to be most appropriate for this patient at this time.    Documentation for virtual audio and video telecommunications through [place] encounter:  The patient was located at home. The provider was located in the office. The patient did consent to this visit and is aware of possible charges through their insurance for this visit.  The other persons participating in this telemedicine service were none. Time spent on call was 20 minutes and in review of previous records 20 minutes total.  This virtual service is not related to other E/M service within previous 7 days.   HPI Chief Complaint  Patient presents with   URI    Cough with yellow/ brown, runny, body aches, chills, sweats, nasal congestion. Has not been able to eat. Started Friday.    Urinary Frequency    No burning or irritation. No increased fluid intake. Recent sexual activity. Started Saturday.    Virtual today for concerns  She notes head hurts, runny nose, hot and cold, chills, nasal congestion.  Been having symptoms since 3.5 days.    No fever.  No sore throat.  Has been having wheezing.  Has 24 yo, and he is sick as well.  He was evaluated 2 weeks ago for similar symptoms.  He is still not better and having visit with his pediatrician tomorrow  On cycle.   No odor in urine.  No vaginal discharge.  No concern for STD.  No other aggravating or relieving factors. No other complaint.   Past Medical History:  Diagnosis Date   Anxiety    Eczema    History of  anemia    Seasonal allergies    Supervision of normal first pregnancy, antepartum 09/02/2020    Nursing Staff Provider Office Location  [facility] Dating   early [place]  Language  English Anatomy [place]    ordered Flu Vaccine   Genetic Screen  NIPS:   AFP:     TDaP Vaccine    Hgb A1C or  GTT Early A1c-  Third trimester  COVID Vaccine    LAB RESULTS  Rhogam   Blood Type    Feeding Plan Breast Antibody   Contraception Undecided Rubella   Circumcision Yes RPR Non Reactive (07/26 1611)  Pediatrician  In   Syncope and collapse    Current Outpatient Medications on File Prior to Visit  Medication Sig Dispense Refill   mirtazapine  (REMERON ) 7.5 MG tablet Take 1 tablet (7.5 mg total) by mouth at bedtime. 30 tablet 1   cetirizine  (ZYRTEC ) 10 MG tablet Take 1 tablet (10 mg total) by mouth at bedtime. (Patient not taking: Reported on 12/20/2023) 90 tablet 1   Prenatal Multivit-Min-Fe-FA (PRENATAL/IRON ) TABS Take 1 tablet by mouth daily in the afternoon. (Patient not taking: Reported on 12/20/2023) 90 tablet 1   Vitamin D , Ergocalciferol , (DRISDOL ) 1.25 MG (50000 UNIT) CAPS capsule Take 1 capsule (50,000 Units total) by mouth every 7 (seven) days. (Patient not taking: Reported on 12/20/2023) 12 capsule 3   No current facility-administered medications on file prior to visit.  Review of Systems As in subjective    Objective:   Physical Exam Due to coronavirus pandemic stay at home measures, patient visit was virtual and they were not examined in person.   Ht 5' 4.5 (1.638 m) Comment: patient reported  Wt 168 lb (76.2 kg) Comment: patient reported  LMP 12/20/2023 (Exact Date)   BMI 28.39 kg/m   Gen: wd, wn, nad No labored breathing or wheezing      Assessment:     Encounter Diagnoses  Name Primary?   Body aches Yes   Urinary frequency    Acute cough    SOB (shortness of breath)    Respiratory tract infection        Plan:      We discussed limitations of virtual consult.  She is unable to come to  our office today for flu and RSV testing or urinalysis Advise rest, hydration, Tylenol  over-the-counter as needed for pain or fever Begin albuterol  inhaler for shortness of breath or wheezing She takes her send back tomorrow to pediatrician for recheck given his ongoing illness symptoms.  We discussed the possibility of flu or RSV or other viral URI  I asked her to call back tomorrow let me know what they say about him we can give her some additional guidance.  Advised if urine symptoms continue or worsen to come in for urinalysis  Currently the going assumption is that she has a viral syndrome/viral URI   Norrine was seen today for uri and urinary frequency.  Diagnoses and all orders for this visit:  Body aches  Urinary frequency  Acute cough  SOB (shortness of breath)  Respiratory tract infection  Other orders -     albuterol  (VENTOLIN  HFA) 108 (90 Base) MCG/ACT inhaler; Inhale 2 puffs into the lungs every 6 (six) hours as needed for wheezing or shortness of breath.    F/u prn

## 2023-12-21 ENCOUNTER — Other Ambulatory Visit: Payer: Self-pay | Admitting: Medical

## 2023-12-21 ENCOUNTER — Telehealth: Payer: Self-pay | Admitting: Medical

## 2023-12-21 MED ORDER — AMOXICILLIN 875 MG PO TABS: 875.0000 mg | ORAL_TABLET | Freq: Two times a day (BID) | ORAL | 0 refills | Status: DC

## 2023-12-21 MED ORDER — PREDNISONE 20 MG PO TABS: ORAL_TABLET | ORAL | 0 refills | Status: DC

## 2023-12-21 NOTE — Telephone Encounter (Signed)
 Pt called she took her son to doctor and everything came back negative, they put him on amoxil She is not sure if you want her to be seen or what she needs to do, she still feels bad

## 2023-12-21 NOTE — Telephone Encounter (Signed)
 Patient advised.

## 2023-12-27 ENCOUNTER — Telehealth: Payer: Self-pay | Admitting: *Deleted

## 2023-12-27 NOTE — Progress Notes (Signed)
 Complex Care Management Note Care Guide Note  12/27/2023 Name: Terri Potts MRN: [id number] DOB: 11-12-2001  Terri Potts is a 22 y.o. year old female who is a primary care patient of Bulah Alm GORMAN DEVONNA . The community resource team was consulted for assistance with Transportation Needs , Food Insecurity, and housing   SDOH screenings and interventions completed:  Yes  SDOH Interventions Today    Flowsheet Row Most Recent Value  SDOH Interventions   Food Insecurity Interventions Community Resources Provided, [id number] Referral  [Will provide resources for food banks , and any other food referals]  Housing Interventions Community Resources Provided  Transportation Interventions [employer] Provided, Payor Benefit  Utilities Interventions Other (Comment)  [Stays with grandmother but will need power  in the future]  Financial Strain Interventions [id number] Referral, Community Resources Provided        Care guide performed the following interventions: Patient provided with information about care guide support team and interviewed to confirm resource needs. Also emailed family and child based resources along with housing options and a [place] [facility] referral   Follow Up Plan:  No further follow up planned at this time. The patient has been provided with needed resources.  Encounter Outcome:  Patient Visit Completed  Nuvia Hileman [facility]  Population Health Careguide  Direct Dial: [phone] Website: [email or web address]

## 2024-01-04 ENCOUNTER — Ambulatory Visit (HOSPITAL_COMMUNITY): Payer: Medicaid Other | Admitting: Clinical

## 2024-01-04 DIAGNOSIS — F331 Major depressive disorder, recurrent, moderate: Secondary | ICD-10-CM

## 2024-01-04 DIAGNOSIS — F4310 Post-traumatic stress disorder, unspecified: Secondary | ICD-10-CM

## 2024-01-06 ENCOUNTER — Telehealth (HOSPITAL_COMMUNITY): Payer: Medicaid Other | Admitting: Student

## 2024-01-06 DIAGNOSIS — F4310 Post-traumatic stress disorder, unspecified: Secondary | ICD-10-CM

## 2024-01-06 DIAGNOSIS — F1221 Cannabis dependence, in remission: Secondary | ICD-10-CM | POA: Diagnosis not present

## 2024-01-06 DIAGNOSIS — F331 Major depressive disorder, recurrent, moderate: Secondary | ICD-10-CM | POA: Diagnosis not present

## 2024-01-06 MED ORDER — HYDROXYZINE HCL 25 MG PO TABS: 25.0000 mg | ORAL_TABLET | Freq: Three times a day (TID) | ORAL | 1 refills | Status: AC | PRN
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
## 2024-01-11 ENCOUNTER — Other Ambulatory Visit: Payer: Self-pay | Admitting: Medical

## 2024-01-12 ENCOUNTER — Ambulatory Visit: Payer: Medicaid Other | Admitting: Medical

## 2024-01-12 ENCOUNTER — Encounter (HOSPITAL_COMMUNITY): Payer: Self-pay | Admitting: Student

## 2024-01-12 VITALS — BP 122/80 | HR 100 | Wt 164.4 lb

## 2024-01-12 DIAGNOSIS — E876 Hypokalemia: Secondary | ICD-10-CM

## 2024-01-12 DIAGNOSIS — G479 Sleep disorder, unspecified: Secondary | ICD-10-CM

## 2024-01-12 DIAGNOSIS — L749 Eccrine sweat disorder, unspecified: Secondary | ICD-10-CM

## 2024-01-12 DIAGNOSIS — N898 Other specified noninflammatory disorders of vagina: Secondary | ICD-10-CM

## 2024-01-12 DIAGNOSIS — Z113 Encounter for screening for infections with a predominantly sexual mode of transmission: Secondary | ICD-10-CM

## 2024-01-12 DIAGNOSIS — E559 Vitamin D deficiency, unspecified: Secondary | ICD-10-CM

## 2024-01-12 MED ORDER — VITAMIN D 50 MCG (2000 UT) PO CAPS: 1.0000 | ORAL_CAPSULE | Freq: Every day | ORAL | 0 refills | Status: DC

## 2024-01-12 NOTE — Progress Notes (Signed)
Subjective:  Terri Potts is a 23 y.o. female who presents for Chief Complaint  Patient presents with   Consult    Would like STD screening and would like to discuss issues sleeping and going through Hot flashes     Here for several concerns  Concerned about doing testing for STD.  She gets a fishy odor every now and then particular after sex and sometimes a chemical smell.  She saw gynecology several weeks ago for the same and although testing was normal she is still having the same symptoms.  She wants to double check on testing.  She is sexually active with the same partner  She has concerns about sleep paralysis.  For several months at least since August she has concerns about sleep.  Sometimes she just tosses and turns in the bed.  After getting to sleep sometimes she will wake and feels like her body will not move.  She feels like there is someone in the room but she feels like she physically cannot move her body to get up.  Sometimes she feels like she sees her grandmother in room who was not really there but she cannot moved to find out.  Sometimes she will get up, and turn the light on, and everything is fine.  She sees psychiatry and recently started mirtazapine 2 weeks ago.  She is supposed to give this a full month trial before they change medications  No prior sleep study.  No prior neurology consult  No prior mental health breakdown requiring hospitalization  She notes that she continues to feel sweaty all the time and flushed even if it is cold outside of cold in the room.  She feels her body just heats up unusually.  This has been going on for a while  She has history of vitamin D deficiency not currently taking vitamin D supplement  LMP just started yesterday   No other aggravating or relieving factors.    No other c/o.  Past Medical History:  Diagnosis Date   Anxiety    Eczema    History of anemia    Seasonal allergies    Supervision of normal first pregnancy,  antepartum 09/02/2020    Nursing Staff Provider Office Location  [facility] Dating   early [place] Language  English Anatomy US   ordered Flu Vaccine   Genetic Screen  NIPS:   AFP:     TDaP Vaccine    Hgb A1C or  GTT Early A1c-  Third trimester  COVID Vaccine    LAB RESULTS  Rhogam   Blood Type    Feeding Plan Breast Antibody   Contraception Undecided Rubella   Circumcision Yes RPR Non Reactive (07/26 1611)  Pediatrician  In   Syncope and collapse    Current Outpatient Medications on File Prior to Visit  Medication Sig Dispense Refill   albuterol (VENTOLIN HFA) 108 (90 Base) MCG/ACT inhaler INHALE 2 PUFFS INTO THE LUNGS EVERY 6 HOURS AS NEEDED FOR WHEEZING OR SHORTNESS OF BREATH 18 g 0   mirtazapine (REMERON) 7.5 MG tablet Take 1 tablet (7.5 mg total) by mouth at bedtime. 30 tablet 1   hydrOXYzine (ATARAX) 25 MG tablet Take 1 tablet (25 mg total) by mouth 3 (three) times daily as needed for anxiety. (Patient not taking: Reported on 01/12/2024) 60 tablet 1   No current facility-administered medications on file prior to visit.     The following portions of the patient's history were reviewed and updated as appropriate:  allergies, current medications, past family history, past medical history, past social history, past surgical history and problem list.  ROS Otherwise as in subjective above    Objective: BP 122/80   Pulse 100   Wt 164 lb 6.4 oz (74.6 kg)   LMP 12/20/2023 (Exact Date)   SpO2 100%   BMI 27.78 kg/m   General appearance: alert, no distress, well developed, well nourished HEENT: normocephalic, sclerae anicteric, conjunctiva pink and moist, TMs pearly, nares patent, no discharge or erythema, pharynx normal Oral cavity: MMM, no lesions Neck: supple, no lymphadenopathy, no thyromegaly, no masses Heart: RRR, normal S1, S2, no murmurs Lungs: CTA bilaterally, no wheezes, rhonchi, or rales Abdomen: +bs, soft, non tender, non distended, no masses, no hepatomegaly, no  splenomegaly Pulses: 2+ radial pulses, 2+ pedal pulses, normal cap refill Ext: no edema Neuro: CN II through XII intact, nonfocal exam Psych: Pleasant, answers question appropriately GU: Pelvic exam performed, lots of blood in the vaginal vault, swabs obtained.  Exam chaperoned by nurse    Assessment: Encounter Diagnoses  Name Primary?   Sweating abnormality Yes   Low blood potassium    Screen for STD (sexually transmitted disease)    Vitamin D deficiency    Vaginal odor    Sleep disturbance      Plan: We discussed her sweating concern and possible differential-updated labs today.  Consider endocrinology consult  Vitamin D deficiency-get back on vitamin D supplement given very low vitamin D in the past  Sleep disturbance, possible sleep paralysis versus anxiety or other-continue with psychiatry, continue trial of mirtazapine that she started recently.  Referral to neurology  Low potassium in the past-updated labs today  Vaginal odor,, screen for STD-labs as below   Francies was seen today for consult.  Diagnoses and all orders for this visit:  Sweating abnormality -     Folate -     Vitamin B12 -     Prolactin  Low blood potassium -     Basic metabolic panel -     Folate -     Vitamin B12  Screen for STD (sexually transmitted disease) -     NuSwab Vaginitis Plus (VG+)  Vitamin D deficiency  Vaginal odor -     NuSwab Vaginitis Plus (VG+)  Sleep disturbance -     Ambulatory referral to Neurology  Other orders -     Cholecalciferol (VITAMIN D) 50 MCG (2000 UT) CAPS; Take 1 capsule (2,000 Units total) by mouth daily.    Follow up: pending referral, labs

## 2024-01-13 LAB — PROLACTIN: Prolactin: 11 ng/mL (ref 4.8–33.4)

## 2024-01-13 LAB — VITAMIN B12: Vitamin B-12: 349 pg/mL (ref 232–1245)

## 2024-01-13 LAB — BASIC METABOLIC PANEL
BUN/Creatinine Ratio: 9 (ref 9–23)
BUN: 7 mg/dL (ref 6–20)
CO2: 22 mmol/L (ref 20–29)
Calcium: 9.4 mg/dL (ref 8.7–10.2)
Chloride: 108 mmol/L — ABNORMAL HIGH (ref 96–106)
Creatinine, Ser: 0.76 mg/dL (ref 0.57–1.00)
Glucose: 86 mg/dL (ref 70–99)
Potassium: 3.6 mmol/L (ref 3.5–5.2)
Sodium: 141 mmol/L (ref 134–144)
eGFR: 113 mL/min/{1.73_m2} (ref >59)

## 2024-01-13 LAB — FOLATE: Folate: 17.4 ng/mL (ref >3.0)

## 2024-01-13 NOTE — Progress Notes (Signed)
Please have lab add HIV test, syphilis RPR test.  I thought I had put those in yesterday  Results sent through MyChart

## 2024-01-15 HISTORY — PX: NO PAST SURGERIES: SHX2092

## 2024-01-16 ENCOUNTER — Other Ambulatory Visit: Payer: Self-pay | Admitting: Medical

## 2024-01-16 LAB — NUSWAB VAGINITIS PLUS (VG+)
BVAB 2: HIGH {score} — AB
Candida albicans, NAA: NEGATIVE
Candida glabrata, NAA: NEGATIVE
Megasphaera 1: HIGH {score} — AB

## 2024-01-16 MED ORDER — METRONIDAZOLE 500 MG PO TABS: 500.0000 mg | ORAL_TABLET | Freq: Two times a day (BID) | ORAL | 0 refills | Status: AC

## 2024-01-16 NOTE — Progress Notes (Signed)
Your swab shows positive for bacterial vaginosis but negative for gonorrhea and chlamydia.  Begin metronidazole 1 tablet twice a day for a week for BV/bacterial vaginosis  Lets see if that helps clears up your current symptoms.

## 2024-01-17 LAB — SPECIMEN STATUS REPORT

## 2024-01-17 LAB — RPR QUALITATIVE: RPR Ser Ql: NONREACTIVE

## 2024-01-17 NOTE — Progress Notes (Signed)
 Comprehensive Clinical Assessment (CCA) Note  01/04/2024 LOANY NEUROTH [id number]  Virtual Visit via Video Note  I connected with [name] on 01/04/2024 at  1:00 PM EST by a video enabled telemedicine application and verified that I am speaking with the correct person using two identifiers.  Location: Patient: home Provider: office   I discussed the limitations of evaluation and management by telemedicine and the availability of in person appointments. The patient expressed understanding and agreed to proceed.   Follow Up Instructions: I discussed the assessment and treatment plan with the patient. The patient was provided an opportunity to ask questions and all were answered. The patient agreed with the plan and demonstrated an understanding of the instructions.   The patient was advised to call back or seek an in-person evaluation if the symptoms worsen or if the condition fails to improve as anticipated.  I provided 30 minutes of non-face-to-face time during this encounter.   Loree Fee, LCSW   Chief Complaint:  Chief Complaint  Patient presents with   Depression   Anxiety   Visit Diagnosis:  Major depressive disorder, recurrent episode, moderate Posttraumatic stress disorder   Interpretive summary:  Client is a 23 year old female presenting to the [facility] to establish with outpatient therapy services.  Client is currently in treatment with a [facility] psychiatrist for the treatment of major depressive disorder and posttraumatic stress disorder.  Client reported as of currently she forgets to take her medications prescribed by the psychiatrist.  Client reported she has a hard time not feeling overwhelmed by her thoughts.  Client reported she does not like to do anything marijuana.  Client reported before she worked all the time but now with the child she does not want to do much of anything.  Client reported she has had passive suicidal  ideations but never acted on harming herself.  Client reported she has been depressed since her father was killed in 2017.  Client reported her supports include her mom and younger sister.  Client reported she currently lives with her grandfather but it is a stressful living situation.  Client reported she has been stressed out with trying to figure out getting her own place.  Client reported her grandfather has mental health issues that he deals with.  Client reported her appetite is lacking.  Client reported she does have bad dreams.  Client reported as instructed by the therapist she will go see a provider about her symptoms of positive sleep paralysis.  Client reported it has been 2 weeks since she used marijuana. Client presented to the appointment oriented x 5, appropriately dressed, and cooperative.  Client denied hallucinations, delusions, suicidal and homicidal ideations.  Client was screened for pain, nutrition, [place] suicide severity and the following S DOH:    01/04/2024    1:24 PM 09/18/2020    9:45 AM 12/28/2018   12:43 PM  GAD 7 : Generalized Anxiety Score  Nervous, Anxious, on Edge 2 1 1   Control/stop worrying 2 1 0  Worry too much - different things 2 2 2   Trouble relaxing 2 2 1   Restless 2 1 2   Easily annoyed or irritable 2 3 1   Afraid - awful might happen 2 0 1  Total GAD 7 Score 14 10 8   Anxiety Difficulty Very difficult Not difficult at all      [employer] Counselor from 01/04/2024 in [facility]  PHQ-9 Total Score 11  Treatment recommendations: Individual therapy and psychiatry     CCA Biopsychosocial Intake/Chief Complaint:  client is currently a patient of [facility] psychiatry for the treatment of major depressive disorder and PTSD.  Current Symptoms/Problems: Client reported depressed mood, lack of motivation, feeling on edge, worrying  Patient Reported Schizophrenia/Schizoaffective Diagnosis in Past: No  Strengths:  Voluntarily engaging in services  Preferences: Counseling and medication management  Abilities: Will go about problems and needs  Type of Services Patient Feels are Needed: Psychiatry and individual therapy  Initial Clinical Notes/Concerns: No data recorded  Mental Health Symptoms Depression:  Change in energy/activity; Difficulty Concentrating   Duration of Depressive symptoms: Greater than two weeks   Mania:  None   Anxiety:   Sleep; Tension   Psychosis:  None   Duration of Psychotic symptoms: No data recorded  Trauma:  Detachment from others   Obsessions:  None   Compulsions:  None   Inattention:  None   Hyperactivity/Impulsivity:  None   Oppositional/Defiant Behaviors:  None   Emotional Irregularity:  None   Other Mood/Personality Symptoms:  No data recorded   Mental Status Exam Appearance and self-care  Stature:  Average   Weight:  Average weight   Clothing:  Casual   Grooming:  Normal   Cosmetic use:  Age appropriate   Posture/gait:  Normal   Motor activity:  Not Remarkable   Sensorium  Attention:  Normal   Concentration:  Normal   Orientation:  X5   Recall/memory:  Normal   Affect and Mood  Affect:  Depressed   Mood:  Depressed   Relating  Eye contact:  Normal   Facial expression:  Responsive   Attitude toward examiner:  Cooperative   Thought and Language  Speech flow: Clear and Coherent   Thought content:  Appropriate to Mood and Circumstances   Preoccupation:  None   Hallucinations:  None   Organization:  No data recorded  [employer] of Knowledge:  Good   Intelligence:  Average   Abstraction:  Normal   Judgement:  Good   Reality Testing:  Adequate   Insight:  Good   Decision Making:  Normal   Social Functioning  Social Maturity:  Responsible   Social Judgement:  Normal   Stress  Stressors:  Family conflict; Transitions   Coping Ability:  Resilient   Skill Deficits:  Activities of  daily living   Supports:  Support needed     Religion: Religion/Spirituality Are You A Religious Person?: No  Leisure/Recreation: Leisure / Recreation Do You Have Hobbies?: No  Exercise/Diet: Exercise/Diet Do You Exercise?: No Have You Gained or Lost A Significant Amount of Weight in the Past Six Months?: No Do You Follow a Special Diet?: No Do You Have Any Trouble Sleeping?: Yes Explanation of Sleeping Difficulties: Client reported she has bad dreams. Client reported she is not sure if that is related to her symptoms of sleep paralysis which she has not yet addressed with the doctor.   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  Childhood History:  Childhood History By whom was/is the patient raised?: Mother  Additional childhood history information: client reported she is from [facility] [place]. client reported reported she was raised by her mother. client reported her father passed in 2017. client reported her childhood was not bad but her mother was strict and yelled alot when frustrated. Does patient have siblings?: Yes Number of Siblings: 5 Description of patient's current relationship with siblings: client reported she has 1 sister and 4 brothers. Did patient suffer any verbal/emotional/physical/sexual abuse as a child?: No Did patient suffer from severe childhood neglect?: No Has patient ever been sexually abused/assaulted/raped as an adolescent or adult?: No Was the patient ever a victim of a crime or a  disaster?: No Witnessed domestic violence?: No Has patient been affected by domestic violence as an adult?: No  Child/Adolescent Assessment:     CCA Substance Use Alcohol/Drug Use: Alcohol / Drug Use History of alcohol / drug use?: No history of alcohol / drug abuse                         ASAM's:  Six Dimensions of Multidimensional Assessment  Dimension 1:  Acute Intoxication and/or Withdrawal Potential:      Dimension 2:  Biomedical Conditions and Complications:      Dimension 3:  Emotional, Behavioral, or Cognitive Conditions and Complications:     Dimension 4:  Readiness to Change:     Dimension 5:  Relapse, Continued use, or Continued Problem Potential:     Dimension 6:  Recovery/Living Environment:     ASAM Severity Score:    ASAM Recommended Level of Treatment:     Substance use Disorder (SUD)    Recommendations for Services/Supports/Treatments: Recommendations for Services/Supports/Treatments Recommendations For Services/Supports/Treatments: Medication Management, Individual Therapy  DSM5 Diagnoses: Patient Active Problem List   Diagnosis Date Noted   MDD (major depressive disorder), recurrent episode, moderate (HCC) 09/08/2023   PTSD (post-traumatic stress disorder) 09/08/2023   Encounter for health maintenance examination 08/23/2023   History of chlamydia 05/21/2023   Chronic pain of both knees 03/29/2023   Hearing decreased, left 03/29/2023   Irregular periods 03/29/2023   Allergic conjunctivitis of both eyes 03/29/2023   Allergic rhinitis due to pollen 03/29/2023   Bowlegged 03/29/2023   Knock knee 03/29/2023   Normal labor 03/23/2021   Syncope 12/18/2020   Marijuana use 09/18/2020   Chronic vomiting 02/13/2019   Chronic nausea 02/13/2019   Loss of appetite 02/13/2019   Gastroesophageal reflux disease with esophagitis 02/13/2019   Unintended weight loss 02/13/2019    Patient Centered Plan: Patient is on the following Treatment Plan(s):   Depression   Referrals to Alternative Service(s): Referred to Alternative Service(s):   Place:   Date:   Time:    Referred to Alternative Service(s):   Place:   Date:   Time:    Referred to Alternative Service(s):   Place:   Date:   Time:    Referred to Alternative Service(s):   Place:   Date:   Time:      Collaboration of Care: Referral or follow-up with counselor/therapist [facility]  Patient/Guardian was advised Release of Information must be obtained prior to any record release in order to collaborate their care with an outside provider. Patient/Guardian was advised if they have not already done so to contact the registration department to sign all necessary forms in order for [place] to release information regarding their care.   Consent: Patient/Guardian gives verbal consent for treatment and assignment of benefits for services provided during this visit. Patient/Guardian  expressed understanding and agreed to proceed.   Neena Rhymes Ritamarie Arkin, LCSW

## 2024-02-04 ENCOUNTER — Other Ambulatory Visit: Payer: Self-pay | Admitting: Medical

## 2024-02-07 ENCOUNTER — Encounter: Payer: Medicaid Other | Admitting: Medical

## 2024-02-07 ENCOUNTER — Ambulatory Visit: Payer: Self-pay | Admitting: Medical

## 2024-02-07 NOTE — Telephone Encounter (Signed)
 Chief Complaint: vaginal itching Symptoms: 4/10 itching, foul-smelling vaginal discharge Frequency: ongoing, office visit on 1/29 for same Pertinent Negatives: Patient denies fever, pain Disposition: [] ED /[] Urgent Care (no appt availability in office) / [x] Appointment(In office/virtual)/ []  [facility]/ [] Home Care/ [] Refused Recommended Disposition /[] [place] [facility]/ []  Follow-up with PCP Additional Notes: Pt reports vaginal itching and discharge. Pt treated with metronidazole after an office visit 1/29, was positive for BV Pt states she did have pain at the vaginal opening but that has resolved. Pt states itching is improved but is still 4/10. Pt endorses white discharge and states it smells like chemicals. Pt states initially the odor was fishy, now it smells like chemicals. Rates itching 4/10. Pt concerned about a yeast infection. RN scheduled pt for 2/27 at 3:15pm. RN let pt know she has a physical scheduled for this afternoon at the office. Pt states she thought that appt was tomorrow. Pt states she is unable to make that appt today since she is at work. RN assured pt she would let the office know she will not make it. RN advised pt call us back if her symptoms worsen before 2/27, she verbalized understanding.  RN called the CAL. CAL said that her appt today was a physical which will have to be rescheduled with the pt. CAL said they can reschedule that appt with the pt when she comes in 2/27 for her acute visit.   Copied from CRM [id number]. Topic: Clinical - Medical Advice >> Feb 07, 2024 11:07 AM Patsy Lager T wrote: Reason for CRM: patient called stated she was in the office on last week and was treated for BV. She is still having symptoms and would like a script for BV and for Yeast Infection. Please f/u with patient Reason for Disposition  Pain in genital area is a chronic symptom (recurrent or ongoing AND present > 4 weeks)    Vaginal itching ongoing for a month. Pain  improving.  Additional Information  Negative: [1] Vaginal itching AND [2] not improved > 3 days following Care Advice    Ongoing vaginal itching with discharge, previously treated with metronidazole.  Answer Assessment - Initial Assessment Questions 1. SYMPTOM: "What's the main symptom you're concerned about?" (e.g., pain, itching, dryness)     Foul-smelling vaginal discharge 2. LOCATION: "Where is the smell located?" (e.g., inside/outside, left/right)      3. ONSET: "When did the  start?"     Seen 1/29 4. PAIN: "Is there any pain?" If Yes, ask: "How bad is it?" (Scale: 1-10; mild, moderate, severe)   -  MILD (1-3): Doesn't interfere with normal activities.    -  MODERATE (4-7): Interferes with normal activities (e.g., work or school) or awakens from sleep.     -  SEVERE (8-10): Excruciating pain, unable to do any normal activities.     No 5. ITCHING: "Is there any itching?" If Yes, ask: "How bad is it?" (Scale: 1-10; mild, moderate, severe)     "Itching" at the vaginal canal, itching has improved. 4/10. 6. CAUSE: "What do you think is causing the discharge?" "Have you had the same problem before? What happened then?"     Tested + for BV 7. OTHER SYMPTOMS: "Do you have any other symptoms?" (e.g., fever, itching, vaginal bleeding, pain with urination, injury to genital area, vaginal foreign body)     Some itching. No fever. No hematuria. No dysuria. White vaginal discharge.  Protocols used: Vaginal Symptoms-A-AH

## 2024-02-10 ENCOUNTER — Encounter: Payer: Self-pay | Admitting: Medical

## 2024-02-10 ENCOUNTER — Ambulatory Visit (INDEPENDENT_AMBULATORY_CARE_PROVIDER_SITE_OTHER): Payer: Medicaid Other | Admitting: Medical

## 2024-02-10 VITALS — BP 110/64 | HR 66 | Ht 64.0 in | Wt 170.0 lb

## 2024-02-10 DIAGNOSIS — R062 Wheezing: Secondary | ICD-10-CM

## 2024-02-10 DIAGNOSIS — Z7185 Encounter for immunization safety counseling: Secondary | ICD-10-CM | POA: Diagnosis not present

## 2024-02-10 DIAGNOSIS — N926 Irregular menstruation, unspecified: Secondary | ICD-10-CM | POA: Diagnosis not present

## 2024-02-10 DIAGNOSIS — Z1389 Encounter for screening for other disorder: Secondary | ICD-10-CM | POA: Diagnosis not present

## 2024-02-10 DIAGNOSIS — Z113 Encounter for screening for infections with a predominantly sexual mode of transmission: Secondary | ICD-10-CM

## 2024-02-10 DIAGNOSIS — N898 Other specified noninflammatory disorders of vagina: Secondary | ICD-10-CM | POA: Insufficient documentation

## 2024-02-10 DIAGNOSIS — Z0000 Encounter for general adult medical examination without abnormal findings: Secondary | ICD-10-CM

## 2024-02-10 DIAGNOSIS — E559 Vitamin D deficiency, unspecified: Secondary | ICD-10-CM | POA: Insufficient documentation

## 2024-02-10 DIAGNOSIS — J301 Allergic rhinitis due to pollen: Secondary | ICD-10-CM

## 2024-02-10 LAB — POCT URINALYSIS DIP (PROADVANTAGE DEVICE)
Blood, UA: NEGATIVE
Glucose, UA: NEGATIVE mg/dL
Nitrite, UA: NEGATIVE
Specific Gravity, Urine: 1.025
Urobilinogen, Ur: NEGATIVE
pH, UA: 6.5 (ref 5.0–8.0)

## 2024-02-10 LAB — POCT WET PREP (WET MOUNT)
Clue Cells Wet Prep Whiff POC: NEGATIVE
Trichomonas Wet Prep HPF POC: ABSENT
WBC, Wet Prep HPF POC: NEGATIVE

## 2024-02-10 LAB — LIPID PANEL

## 2024-02-10 LAB — POCT URINE PREGNANCY: Preg Test, Ur: NEGATIVE

## 2024-02-10 MED ORDER — FLUCONAZOLE 100 MG PO TABS: 100.0000 mg | ORAL_TABLET | Freq: Every day | ORAL | 0 refills | Status: DC

## 2024-02-10 MED ORDER — VITAMIN D (ERGOCALCIFEROL) 1.25 MG (50000 UNIT) PO CAPS: 50000.0000 [IU] | ORAL_CAPSULE | ORAL | 1 refills | Status: DC

## 2024-02-10 MED ORDER — NYSTATIN 100000 UNIT/GM EX CREA: 1.0000 | TOPICAL_CREAM | Freq: Two times a day (BID) | CUTANEOUS | 0 refills | Status: DC

## 2024-02-10 MED ORDER — FLUTICASONE-SALMETEROL 100-50 MCG/ACT IN AEPB: 1.0000 | INHALATION_SPRAY | Freq: Two times a day (BID) | RESPIRATORY_TRACT | 2 refills | Status: AC

## 2024-02-10 NOTE — Assessment & Plan Note (Addendum)
 In discussion today she notes dyspnea and wheezing in the past year that suggest asthma.  She does have underlying history of eczema and allergies.  Begin trial of Advair.  Apparently her mom uses Advair as well.  Discussed proper use of Advair.  Discussed difference between rescue and maintenance inhaler.  She already has an albuterol at home.  Advise she stop smoking cigars, stop smoking in general.  Let me know in the next 2 weeks how her symptoms are doing

## 2024-02-10 NOTE — Assessment & Plan Note (Signed)
 Positive yeast on wet prep today and signs of yeast vaginitis today on exam.  Begin oral Diflucan x 1 week and can use topical nystatin cream in the area of concern.  Call report in 2 weeks and let me know if symptoms are resolved

## 2024-02-10 NOTE — Progress Notes (Signed)
 Subjective: Chief Complaint  Patient presents with   Annual Exam    Fasting cpe, still having vaginal issues from 1/29 visit. Declines tdap and pneumonia shots   Here for well visit  Still having some white cottage cheese vaginal discharge.  Last visit a few weeks ago was +BV and she did a week of metronidazole oral medication.   Did get some better. No longer has odor, but now discharge as noted  No new sexual partners  No hx/o asthma, but has felt wheezing often in past year.  She did get inhaler from last visit.   She has hx/o allergies and eczema.  She does smoke black and mild cigars.  She notes rash on upper thighs and underneath on buttocks . Has had this prior.  In past gyn did biopsy but was reportedly normal, no specific diagnosis.    LMP 01/11/24.  Sometimes periods are heavy.  Is a little late on current period.  Some months early, some months late.    Sees Theodoro Kos for anxiety, on medication for this.    Most recent fall risk assessment:    01/12/2024    3:14 PM  Fall Risk   Falls in the past year? 0  Number falls in past yr: 0  Injury with Fall? 0  Risk for fall due to : No Fall Risks  Follow up Falls evaluation completed     Most recent depression screenings:    01/12/2024    3:14 PM 01/04/2024    1:19 PM  PHQ 2/9 Scores  PHQ - 2 Score 6   PHQ- 9 Score 24      Information is confidential and restricted. Go to Review Flowsheets to unlock data.    Dental: No current dental problems  Patient Active Problem List   Diagnosis Date Noted   Wheezing 02/10/2024   Screen for STD (sexually transmitted disease) 02/10/2024   Vaginal discharge 02/10/2024   Encounter for health maintenance examination in adult 02/10/2024   Vaccine counseling 02/10/2024   Vitamin D deficiency 02/10/2024   PTSD (post-traumatic stress disorder) 09/08/2023   Chronic pain of both knees 03/29/2023   Hearing decreased, left 03/29/2023   Allergic conjunctivitis of both eyes  03/29/2023   Allergic rhinitis due to pollen 03/29/2023   Bowlegged 03/29/2023   Knock knee 03/29/2023   Chronic nausea 02/13/2019   Past Medical History:  Diagnosis Date   Anxiety    Eczema    History of anemia    Seasonal allergies    Supervision of normal first pregnancy, antepartum 09/02/2020    Nursing Staff Provider Office Location  [facility] Dating   early [place] Language  English Anatomy [place]   ordered Flu Vaccine   Genetic Screen  NIPS:   AFP:     TDaP Vaccine    Hgb A1C or  GTT Early A1c-  Third trimester  COVID Vaccine    LAB RESULTS  Rhogam   Blood Type    Feeding Plan Breast Antibody   Contraception Undecided Rubella   Circumcision Yes RPR Non Reactive (07/26 1611)  Pediatrician  In   Syncope and collapse    Past Surgical History:  Procedure Laterality Date   NO PAST SURGERIES  01/2024   Social History   Tobacco Use   Smoking status: Every Day    Types: Cigars   Smokeless tobacco: Never  Vaping Use   Vaping status: Never Used  Substance Use Topics   Alcohol use: Yes    Comment:  occasional, not weekly   Drug use: Not Currently    Types: Marijuana   Family History  Problem Relation Age of Onset   Bronchitis Mother    Diabetes Maternal Grandmother    Hypertension Maternal Grandmother    Hypertension Paternal Grandmother    Heart murmur Maternal Aunt    Stroke Neg Hx    Cancer Neg Hx    Allergies  Allergen Reactions   Bee Venom Swelling    At site of bite      Patient Care Team: Anandi Abramo, Kermit Balo, PA-C as PCP - General (Family Medicine) Ob/Gyn, [facility]   Outpatient Medications Prior to Visit  Medication Sig   hydrOXYzine (ATARAX) 25 MG tablet Take 1 tablet (25 mg total) by mouth 3 (three) times daily as needed for anxiety.   mirtazapine (REMERON) 7.5 MG tablet Take 1 tablet (7.5 mg total) by mouth at bedtime.   VENTOLIN HFA 108 (90 Base) MCG/ACT inhaler INHALE 2 PUFFS INTO THE LUNGS EVERY 6 HOURS AS NEEDED FOR WHEEZING OR SHORTNESS OF BREATH    [DISCONTINUED] Cholecalciferol (VITAMIN D) 50 MCG (2000 UT) CAPS Take 1 capsule (2,000 Units total) by mouth daily.   No facility-administered medications prior to visit.    Review of Systems  Constitutional:  Negative for chills, fever, malaise/fatigue and weight loss.  HENT:  Negative for congestion, ear pain, hearing loss, sore throat and tinnitus.   Eyes:  Negative for blurred vision, pain and redness.  Respiratory:  Positive for wheezing. Negative for cough, hemoptysis and shortness of breath.   Cardiovascular:  Negative for chest pain, palpitations, orthopnea, claudication and leg swelling.  Gastrointestinal:  Negative for abdominal pain, blood in stool, constipation, diarrhea, nausea and vomiting.  Genitourinary:  Negative for dysuria, flank pain, frequency, hematuria and urgency.  Musculoskeletal:  Negative for falls, joint pain and myalgias.  Skin:  Negative for itching and rash.  Neurological:  Negative for dizziness, tingling, speech change, weakness and headaches.  Endo/Heme/Allergies:  Negative for polydipsia. Does not bruise/bleed easily.  Psychiatric/Behavioral:  Negative for depression and memory loss. The patient is nervous/anxious. The patient does not have insomnia.        Objective:     BP 110/64   Pulse 66   Ht 5\' 4"  (1.626 m)   Wt 170 lb (77.1 kg)   LMP 01/11/2024   BMI 29.18 kg/m   Physical Exam Vitals and nursing note reviewed.  Constitutional:      General: She is not in acute distress.    Appearance: Normal appearance. She is not ill-appearing.  HENT:     Head: Normocephalic and atraumatic.     Right Ear: External ear normal.     Left Ear: External ear normal.     Nose: Nose normal.     Mouth/Throat:     Mouth: Mucous membranes are moist.     Pharynx: Oropharynx is clear.  Eyes:     Extraocular Movements: Extraocular movements intact.     Conjunctiva/sclera: Conjunctivae normal.     Pupils: Pupils are equal, round, and reactive to light.   Neck:     Vascular: No carotid bruit.  Cardiovascular:     Rate and Rhythm: Normal rate and regular rhythm.     Pulses: Normal pulses.     Heart sounds: Normal heart sounds. No murmur heard. Pulmonary:     Effort: Pulmonary effort is normal. No respiratory distress.     Breath sounds: Wheezing present. No rales.  Abdominal:  General: Bowel sounds are normal. There is no distension.     Palpations: Abdomen is soft. There is no mass.     Tenderness: There is no abdominal tenderness.     Hernia: No hernia is present.  Genitourinary:    Comments: There is some hypopigmentation pinkish coloration inferior to the vagina adjacent to the vagina suggestive of fungal infection/vaginitis.  External vagina normal.  Wet prep swab taken.  Exam chaperoned by nurse Musculoskeletal:        General: No swelling, tenderness or deformity. Normal range of motion.     Cervical back: Normal range of motion and neck supple. No tenderness.     Right lower leg: No edema.     Left lower leg: No edema.  Lymphadenopathy:     Cervical: No cervical adenopathy.  Skin:    General: Skin is warm and dry.     Capillary Refill: Capillary refill takes less than 2 seconds.     Findings: Rash present.  Neurological:     Mental Status: She is alert and oriented to person, place, and time. Mental status is at baseline.     Cranial Nerves: No cranial nerve deficit.     Sensory: No sensory deficit.     Motor: No weakness.     Gait: Gait normal.     Deep Tendon Reflexes: Reflexes normal.  Psychiatric:        Mood and Affect: Mood normal.        Behavior: Behavior normal.        Judgment: Judgment normal.         Assessment & Plan:    Routine Health Maintenance and Physical Exam  Immunization History  Administered Date(s) Administered   DTaP 07/15/2000, 10/25/2000, 06/15/2001, 02/24/2002, 12/19/2004   HIB (PRP-OMP) 07/15/2000, 10/25/2000, 06/15/2001, 02/24/2002   Hepatitis A 11/24/2005, 01/19/2008    Hepatitis B 04/26/2000, 07/15/2000, 10/25/2000   Hpv-Unspecified 05/22/2014, 02/12/2017   IPV 07/15/2000, 10/25/2000, 06/15/2001, 12/19/2004   MMR 06/15/2001, 12/19/2004   Meningococcal B Recombinant 02/12/2017, 06/02/2017   Meningococcal Conjugate 05/05/2011, 02/12/2017   Pneumococcal Conjugate-13 07/15/2000, 10/25/2000, 06/15/2001   Tdap 05/05/2011   Varicella 06/15/2001, 11/24/2005   I recommend updated Tdap tetanus vaccine and Pneumococcal 23 vaccine    Health Maintenance  Topic Date Due   COVID-19 Vaccine (1 - 2024-25 season) 02/26/2024 (Originally 08/15/2023)   INFLUENZA VACCINE  03/13/2024 (Originally 07/15/2023)   DTaP/Tdap/Td (7 - Td or Tdap) 02/09/2025 (Originally 05/04/2021)   Pneumococcal Vaccine 47-75 Years old (1 of 1 - PPSV23 or PCV20) 02/09/2025 (Originally 03/29/2006)   CHLAMYDIA SCREENING  01/11/2025   Cervical Cancer Screening (Pap smear)  07/15/2025   HPV VACCINES  Completed   Hepatitis C Screening  Completed   HIV Screening  Completed    Discussed health benefits of physical activity, and encouraged her to engage in regular exercise appropriate for her age and condition.  Problem List Items Addressed This Visit     Allergic rhinitis due to pollen   Wheezing   In discussion today she notes dyspnea and wheezing in the past year that suggest asthma.  She does have underlying history of eczema and allergies.  Begin trial of Advair.  Apparently her mom uses Advair as well.  Discussed proper use of Advair.  Discussed difference between rescue and maintenance inhaler.  She already has an albuterol at home.  Advise she stop smoking cigars, stop smoking in general.  Let me know in the next 2 weeks how her symptoms are  doing      Screen for STD (sexually transmitted disease)   Relevant Orders   HIV Antibody (routine testing w rflx)   Vaginal discharge   Positive yeast on wet prep today and signs of yeast vaginitis today on exam.  Begin oral Diflucan x 1 week and can use  topical nystatin cream in the area of concern.  Call report in 2 weeks and let me know if symptoms are resolved      Relevant Orders   POCT Wet Prep [facility]) (Completed)   Encounter for health maintenance examination in adult - Primary   Relevant Orders   CBC with Differential/Platelet   Hepatic function panel   Lipid panel   POCT Urinalysis DIP (Proadvantage Device) (Completed)   POCT urine pregnancy (Completed)   HIV Antibody (routine testing w rflx)   Vaccine counseling   Vitamin D deficiency   Begin supplement.  She will call us back if there is an issue at the pharmacy getting this medication      Other Visit Diagnoses       Screening for hematuria or proteinuria       Relevant Orders   POCT Urinalysis DIP (Proadvantage Device) (Completed)     Missed period       Relevant Orders   POCT urine pregnancy (Completed)      Lynora was seen today for annual exam.  Diagnoses and all orders for this visit:  Encounter for health maintenance examination in adult -     CBC with Differential/Platelet -     Hepatic function panel -     Lipid panel -     POCT Urinalysis DIP (Proadvantage Device) -     POCT urine pregnancy -     HIV Antibody (routine testing w rflx)  Vaccine counseling  Screening for hematuria or proteinuria -     POCT Urinalysis DIP (Proadvantage Device)  Missed period -     POCT urine pregnancy  Vaginal discharge -     POCT Wet Prep [employer])  Screen for STD (sexually transmitted disease) -     HIV Antibody (routine testing w rflx)  Wheezing -     Cancel: Spirometry with Graph  Allergic rhinitis due to pollen, unspecified seasonality  Vitamin D deficiency  Other orders -     fluticasone-salmeterol (ADVAIR DISKUS) 100-50 MCG/ACT AEPB; Inhale 1 puff into the lungs 2 (two) times daily. -     fluconazole (DIFLUCAN) 100 MG tablet; Take 1 tablet (100 mg total) by mouth daily. -     nystatin cream (MYCOSTATIN); Apply 1 Application topically 2  (two) times daily. -     Vitamin D, Ergocalciferol, (DRISDOL) 1.25 MG (50000 UNIT) CAPS capsule; Take 1 capsule (50,000 Units total) by mouth every 7 (seven) days.    Return for CPX physical fasting.     Kristian Covey, PA-C

## 2024-02-10 NOTE — Assessment & Plan Note (Signed)
 Begin supplement.  She will call us back if there is an issue at the pharmacy getting this medication

## 2024-02-11 LAB — CBC WITH DIFFERENTIAL/PLATELET
Basophils Absolute: 0 10*3/uL (ref 0.0–0.2)
Basos: 1 %
EOS (ABSOLUTE): 0.1 10*3/uL (ref 0.0–0.4)
Eos: 2 %
Hematocrit: 36.6 % (ref 34.0–46.6)
Hemoglobin: 11.7 g/dL (ref 11.1–15.9)
Immature Grans (Abs): 0 10*3/uL (ref 0.0–0.1)
Immature Granulocytes: 0 %
Lymphocytes Absolute: 2.2 10*3/uL (ref 0.7–3.1)
Lymphs: 52 %
MCH: 27.9 pg (ref 26.6–33.0)
MCHC: 32 g/dL (ref 31.5–35.7)
MCV: 87 fL (ref 79–97)
Monocytes Absolute: 0.4 10*3/uL (ref 0.1–0.9)
Monocytes: 8 %
Neutrophils Absolute: 1.6 10*3/uL (ref 1.4–7.0)
Neutrophils: 37 %
Platelets: 193 10*3/uL (ref 150–450)
RBC: 4.2 x10E6/uL (ref 3.77–5.28)
RDW: 13.2 % (ref 11.7–15.4)
WBC: 4.2 10*3/uL (ref 3.4–10.8)

## 2024-02-11 LAB — HEPATIC FUNCTION PANEL
ALT: 15 IU/L (ref 0–32)
AST: 21 IU/L (ref 0–40)
Albumin: 4.5 g/dL (ref 4.0–5.0)
Alkaline Phosphatase: 44 IU/L (ref 44–121)
Bilirubin Total: 0.5 mg/dL (ref 0.0–1.2)
Bilirubin, Direct: 0.17 mg/dL (ref 0.00–0.40)
Total Protein: 7.4 g/dL (ref 6.0–8.5)

## 2024-02-11 LAB — LIPID PANEL
Cholesterol, Total: 189 mg/dL (ref 100–199)
HDL: 91 mg/dL (ref >39)
LDL CALC COMMENT:: 2.1 ratio (ref 0.0–4.4)
LDL Chol Calc (NIH): 88 mg/dL (ref 0–99)
Triglycerides: 53 mg/dL (ref 0–149)
VLDL Cholesterol Cal: 10 mg/dL (ref 5–40)

## 2024-02-11 LAB — HIV ANTIBODY (ROUTINE TESTING W REFLEX)

## 2024-02-11 NOTE — Progress Notes (Signed)
 Results sent through MyChart

## 2024-04-19 ENCOUNTER — Ambulatory Visit: Admitting: Medical

## 2024-04-19 VITALS — BP 112/70 | HR 60 | Temp 97.3°F | Wt 168.6 lb

## 2024-04-19 DIAGNOSIS — L209 Atopic dermatitis, unspecified: Secondary | ICD-10-CM | POA: Diagnosis not present

## 2024-04-19 DIAGNOSIS — N760 Acute vaginitis: Secondary | ICD-10-CM | POA: Diagnosis not present

## 2024-04-19 DIAGNOSIS — N898 Other specified noninflammatory disorders of vagina: Secondary | ICD-10-CM

## 2024-04-19 LAB — POCT WET PREP (WET MOUNT)
Clue Cells Wet Prep Whiff POC: NEGATIVE
Trichomonas Wet Prep HPF POC: ABSENT
WBC, Wet Prep HPF POC: NEGATIVE

## 2024-04-19 MED ORDER — TRIAMCINOLONE ACETONIDE 0.1 % EX CREA: 1.0000 | TOPICAL_CREAM | Freq: Two times a day (BID) | CUTANEOUS | 0 refills | Status: AC

## 2024-04-19 MED ORDER — METRONIDAZOLE 500 MG PO TABS: ORAL_TABLET | ORAL | 0 refills | Status: DC

## 2024-04-19 MED ORDER — FLUCONAZOLE 150 MG PO TABS: ORAL_TABLET | ORAL | 0 refills | Status: DC

## 2024-04-19 NOTE — Progress Notes (Signed)
 Subjective:  Terri Potts is a 23 y.o. female who presents for Chief Complaint  Patient presents with   Acute Visit    Possible BV, started symptoms Monday- chemical smell, creamy discharge. Started period this morning     Here for concern for BV.  Symptoms started 3 days ago.  She has a little of a chemical smell and some creamy discharge similar to prior BV.  She also started her period today.  She has been treated for BV and mycoplasma genitalium in March, was on metronidazole  and doxycycline.  She was also treated in February for yeast.  She was treated for BV in 12/2023.   No fever, no urinary change, no body aches or chills  Also having some mild eczema flare up on left lateral neck.   No other aggravating or relieving factors.    No other c/o.  Past Medical History:  Diagnosis Date   Anxiety    Eczema    History of anemia    Seasonal allergies    Supervision of normal first pregnancy, antepartum 09/02/2020    Nursing Staff Provider Office Location  [facility] Dating   early [place]  Language  English Anatomy [place]    ordered Flu Vaccine   Genetic Screen  NIPS:   AFP:     TDaP Vaccine    Hgb A1C or  GTT Early A1c-  Third trimester  COVID Vaccine    LAB RESULTS  Rhogam   Blood Type    Feeding Plan Breast Antibody   Contraception Undecided Rubella   Circumcision Yes RPR Non Reactive (07/26 1611)  Pediatrician  In   Syncope and collapse    Current Outpatient Medications on File Prior to Visit  Medication Sig Dispense Refill   fluticasone -salmeterol (ADVAIR DISKUS) 100-50 MCG/ACT AEPB Inhale 1 puff into the lungs 2 (two) times daily. (Patient not taking: Reported on 04/19/2024) 60 each 2   mirtazapine  (REMERON ) 7.5 MG tablet Take 1 tablet (7.5 mg total) by mouth at bedtime. 30 tablet 1   VENTOLIN  HFA 108 (90 Base) MCG/ACT inhaler INHALE 2 PUFFS INTO THE LUNGS EVERY 6 HOURS AS NEEDED FOR WHEEZING OR SHORTNESS OF BREATH (Patient not taking: Reported on 04/19/2024) 18 g 0   Vitamin D , Ergocalciferol ,  (DRISDOL ) 1.25 MG (50000 UNIT) CAPS capsule Take 1 capsule (50,000 Units total) by mouth every 7 (seven) days. (Patient not taking: Reported on 04/19/2024) 12 capsule 1   No current facility-administered medications on file prior to visit.     The following portions of the patient's history were reviewed and updated as appropriate: allergies, current medications, past family history, past medical history, past social history, past surgical history and problem list.  ROS Otherwise as in subjective above    Objective: BP 112/70   Pulse 60   Temp (!) 97.3 F (36.3 C)   Wt 168 lb 9.6 oz (76.5 kg)   BMI 28.94 kg/m   General appearance: alert, no distress, well developed, well nourished Abdomen: +bs, soft, non tender, non distended, no masses, no hepatomegaly, no splenomegaly GU declined, on period, heavy   Assessment: Encounter Diagnoses  Name Primary?   Vaginal odor Yes   Vaginal discharge    Acute vaginitis    Atopic dermatitis, unspecified type      Plan: We discussed her concerns, recent recurrent issues with bacterial vaginosis over the last several months.  She also was treated for mycoplasma genitalium.  We discussed possible treatment for recurrent BV, and some of the latest recommendations  are as below:   Topical metronidazole  gel (0.75%) applied intravaginally twice weekly for 4-6 months is an approved suppressive therapy.[1-3]  Oral metronidazole  (2 g) combined with oral fluconazole  (150 mg) monthly may reduce BV incidence and promote normal vaginal flora.[1-3]  We will avoid boric acid suppositories since she has a 38-year-old at home , and we don't want to risk her young child getting a hold of the  boric acid.  She recently completed a course of metronidazole  and doxycycline 02/2024.  Self Wet prep test today shows mild amount of clue cells and yeast.    Given recurrence in recent months, she opts for oral diflucan  and oral metronidazole  monthly for the next  4-6 months.  Consider repeat full STD screen and BV swabs in 3 months  Advised condom use, avoid high sugar in the diet, use cotton underwear, and keep hygiene products mild and consistent, no frequent hygiene product changes   Atopic dermatitis - advised daily moisturizing lotion.  Can use triamcinolone  cream for worse flare up but don't use for more than a week at a time, and dont use on face.   [name] was seen today for acute visit.  Diagnoses and all orders for this visit:  Vaginal odor -     POCT Wet Prep [facility])  Vaginal discharge -     POCT Wet Prep [facility])  Acute vaginitis -     POCT Wet Prep ([facility] [place])  Atopic dermatitis, unspecified type  Other orders -     metroNIDAZOLE  (FLAGYL ) 500 MG tablet; 4 tablets once per month x 4-6 months -     fluconazole  (DIFLUCAN ) 150 MG tablet; 1 tablet monthly x 4-6 months -     triamcinolone  cream (KENALOG ) 0.1 %; Apply 1 Application topically 2 (two) times daily.    Follow up: 34mo

## 2024-08-19 ENCOUNTER — Other Ambulatory Visit: Payer: Self-pay | Admitting: Medical

## 2024-12-06 ENCOUNTER — Ambulatory Visit: Admitting: Family Medicine

## 2024-12-12 ENCOUNTER — Encounter: Payer: Self-pay | Admitting: Family Medicine

## 2024-12-12 ENCOUNTER — Ambulatory Visit: Admitting: Family Medicine

## 2024-12-12 VITALS — BP 116/74 | HR 92 | Wt 162.6 lb

## 2024-12-12 DIAGNOSIS — Z113 Encounter for screening for infections with a predominantly sexual mode of transmission: Secondary | ICD-10-CM | POA: Diagnosis not present

## 2024-12-12 DIAGNOSIS — L309 Dermatitis, unspecified: Secondary | ICD-10-CM

## 2024-12-12 MED ORDER — TRIAMCINOLONE ACETONIDE 0.1 % EX CREA: 1.0000 | TOPICAL_CREAM | Freq: Two times a day (BID) | CUTANEOUS | 0 refills | Status: AC

## 2024-12-12 NOTE — Progress Notes (Signed)
" ° °  Name: [name]   Date of Visit: 12/12/2024   Date of last visit with me: Visit date not found   CHIEF COMPLAINT:  Chief Complaint  Patient presents with   other    STD testing, no symptoms, breaking out on chin and between breasts maybe eczema, had that when she was younger,         HPI:  Discussed the use of AI scribe software for clinical note transcription with the patient, who gave verbal consent to proceed.  History of Present Illness   EMMIE FRAKES is a 23 year old female who presents for a screening and evaluation of eczema.  She has a history of eczema during childhood, primarily affecting her arms. She reports noticing small bumps on her skin, which she describes as 'little like tiny bumps' that she had not seen before. The eczema presents as small bumps, which she describes as 'little like tiny bumps' that she had not seen before.  She is unsure if the creams her son uses, including one in a yellow and white tube, are suitable for her face.         OBJECTIVE:       01/12/2024    3:14 PM  Depression screen PHQ 2/9  Decreased Interest 3  Down, Depressed, Hopeless 3  PHQ - 2 Score 6  Altered sleeping 3  Tired, decreased energy 3  Change in appetite 3  Feeling bad or failure about yourself  2  Trouble concentrating 3  Moving slowly or fidgety/restless 3  Suicidal thoughts 1  PHQ-9 Score 24   Difficult doing work/chores Not difficult at all     Data saved with a previous flowsheet row definition     BP Readings from Last 3 Encounters:  12/12/24 116/74  04/19/24 112/70  02/10/24 110/64    BP 116/74   Pulse 92   Wt 162 lb 9.6 oz (73.8 kg)   BMI 27.91 kg/m    Physical Exam          Physical Exam Constitutional:      Appearance: Normal appearance.  Neurological:     General: No focal deficit present.     Mental Status: She is alert and oriented to person, place, and time. Mental status is at baseline.   Skin rash shows some bumps on  chin, likely irritant related/eczema-like.  No signs of any scaling.  No erythema. ASSESSMENT/PLAN:   Assessment & Plan Screen for STD (sexually transmitted disease)  Eczema, unspecified type    Assessment and Plan    Screening for sexually transmitted infections Undergoing routine screening. - Await urine test results and notify her of any findings.  Eczema Mild eczema with small bumps. Advised against initial steroid use due to risk of skin whitening, especially in African American skin. - Prescribed CeraVe with hydroxyurea for topical application. - Instructed to apply to damp skin after showering or throughout the day after lightly moistening skin. - Advised to apply to affected areas such as between the breasts and over the chin. - If no improvement, will prescribe a steroid cream.         Emory Leaver A. [facility]ita MD [facility] "

## 2024-12-12 NOTE — Addendum Note (Signed)
 Addended by: EFRAIN CORNET D on: 12/12/2024 04:47 PM   Modules accepted: Orders

## 2024-12-13 LAB — NUSWAB VAGINITIS PLUS (VG+)
Candida albicans, NAA: NEGATIVE
Candida glabrata, NAA: NEGATIVE
Chlamydia trachomatis, NAA: POSITIVE — AB
Neisseria gonorrhoeae, NAA: NEGATIVE
Trich vag by NAA: NEGATIVE

## 2024-12-15 ENCOUNTER — Ambulatory Visit: Payer: Self-pay | Admitting: Family Medicine

## 2024-12-15 DIAGNOSIS — A749 Chlamydial infection, unspecified: Secondary | ICD-10-CM

## 2024-12-15 MED ORDER — DOXYCYCLINE HYCLATE 100 MG PO TABS: 100.0000 mg | ORAL_TABLET | Freq: Two times a day (BID) | ORAL | 0 refills | Status: AC

## 2024-12-15 NOTE — Progress Notes (Signed)
 Called Patient she was already aware of results and waiting on RX to be filled.

## 2024-12-21 ENCOUNTER — Other Ambulatory Visit: Payer: Self-pay | Admitting: Medical

## 2024-12-21 ENCOUNTER — Other Ambulatory Visit: Payer: Self-pay | Admitting: Internal Medicine

## 2024-12-21 ENCOUNTER — Ambulatory Visit: Admitting: Medical

## 2024-12-21 NOTE — Telephone Encounter (Signed)
 Pt was placed on doxycycline  recently

## 2024-12-25 ENCOUNTER — Other Ambulatory Visit: Payer: Self-pay | Admitting: Medical

## 2024-12-25 NOTE — Telephone Encounter (Unsigned)
 Copied from CRM #[id number]. Topic: Clinical - Medication Refill >> Dec 25, 2024 12:30 PM Amy B wrote: Medication:  fluticasone -salmeterol (ADVAIR DISKUS) 100-50 MCG/ACT AEPB metroNIDAZOLE  (FLAGYL ) 500 MG tablet   Has the patient contacted their pharmacy? No (Agent: If no, request that the patient contact the pharmacy for the refill. If patient does not wish to contact the pharmacy document the reason why and proceed with request.) (Agent: If yes, when and what did the pharmacy advise?)  This is the patient's preferred pharmacy:  [facility] [phone] - [place], [place] - [address] AT [facility] [address] [place] [address] Phone: [phone] Fax: [phone]  Is this the correct pharmacy for this prescription? Yes If no, delete pharmacy and type the correct one.   Has the prescription been filled recently? No  Is the patient out of the medication? Yes  Has the patient been seen for an appointment in the last year OR does the patient have an upcoming appointment? Yes  Can we respond through MyChart? Yes  Agent: Please be advised that Rx refills may take up to 3 business days. We ask that you follow-up with your pharmacy.

## 2024-12-25 NOTE — Telephone Encounter (Signed)
 She doesn't need to be on this cause she is on doxcycline right?

## 2024-12-27 ENCOUNTER — Telehealth: Payer: Self-pay | Admitting: Internal Medicine

## 2024-12-27 ENCOUNTER — Telehealth: Payer: Self-pay

## 2024-12-27 NOTE — Telephone Encounter (Signed)
 Copied from CRM [id number]. Topic: Clinical - Medical Advice >> Dec 27, 2024 11:10 AM Myrick T wrote: Reason for CRM: patient stated she started and completed the doxcycline. She noticed around the 2nd day of taking it a fishy smell and discharge. Patient thinks its BV or yeast infection. Please f/u with patient

## 2024-12-27 NOTE — Telephone Encounter (Signed)
 Please schedule visit per Vita

## 2024-12-28 ENCOUNTER — Encounter: Payer: Self-pay | Admitting: Family Medicine

## 2024-12-28 ENCOUNTER — Ambulatory Visit (INDEPENDENT_AMBULATORY_CARE_PROVIDER_SITE_OTHER): Admitting: Family Medicine

## 2024-12-28 VITALS — BP 108/62 | HR 71 | Wt 165.0 lb

## 2024-12-28 DIAGNOSIS — N898 Other specified noninflammatory disorders of vagina: Secondary | ICD-10-CM

## 2024-12-28 MED ORDER — METRONIDAZOLE 0.75 % EX GEL: 1.0000 | Freq: Two times a day (BID) | CUTANEOUS | 0 refills | Status: AC

## 2024-12-28 NOTE — Addendum Note (Signed)
 Addended by: LATTIE CARLO BROCKS on: 12/28/2024 04:44 PM   Modules accepted: Orders

## 2024-12-28 NOTE — Progress Notes (Signed)
" ° °  Name: Terri Potts   Date of Visit: 12/28/24   Date of last visit with me: 12/12/2024   CHIEF COMPLAINT:  Chief Complaint  Patient presents with   Acute Visit    BV or yeast infection. Random periods. Irregular.        HPI:  Discussed the use of AI scribe software for clinical note transcription with the patient, who gave verbal consent to proceed.  History of Present Illness   Terri Potts is a 24 year old female with a history of chlamydia and bacterial vaginosis who presents with concerns of vaginal odor and bleeding.  She noticed a vaginal odor after starting her medication, which became apparent on the second day. She also experienced unexpected bleeding yesterday, initially thought to be the start of her menstrual cycle, but it resolved by today.  She has a history of testing positive for chlamydia on December 30th. A year ago, she tested positive for BV. She has been on a six-month course of treatment for BV in the past, typically using oral antibiotics, but she has not used MetroGel  before.  Her current medication regimen includes treatment for chlamydia. No issues with urination or changes in urine odor, but she describes the vaginal discharge as odorous.         OBJECTIVE:       01/12/2024    3:14 PM  Depression screen PHQ 2/9  Decreased Interest 3  Down, Depressed, Hopeless 3  PHQ - 2 Score 6  Altered sleeping 3  Tired, decreased energy 3  Change in appetite 3  Feeling bad or failure about yourself  2  Trouble concentrating 3  Moving slowly or fidgety/restless 3  Suicidal thoughts 1  PHQ-9 Score 24   Difficult doing work/chores Not difficult at all     Data saved with a previous flowsheet row definition     BP Readings from Last 3 Encounters:  12/28/24 108/62  12/12/24 116/74  04/19/24 112/70    BP 108/62   Pulse 71   Wt 165 lb (74.8 kg)   SpO2 99%   BMI 28.32 kg/m    Physical Exam          Physical Exam Constitutional:       Appearance: Normal appearance.  Neurological:     General: No focal deficit present.     Mental Status: She is alert and oriented to person, place, and time. Mental status is at baseline.     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 Prefers topical treatment. - Prescribed MetroGel , apply twice daily for five days. - Sent prescription to pharmacy. - Await test results for confirmation. NuSwab pending         Mycah Formica A. Vita MD [facility] "

## 2024-12-30 LAB — NUSWAB VAGINITIS PLUS (VG+)
Atopobium vaginae: HIGH {score} — AB
Candida albicans, NAA: POSITIVE — AB
Candida glabrata, NAA: NEGATIVE
Chlamydia trachomatis, NAA: NEGATIVE
Neisseria gonorrhoeae, NAA: NEGATIVE
Trich vag by NAA: NEGATIVE

## 2025-01-01 ENCOUNTER — Ambulatory Visit: Payer: Self-pay | Admitting: Family Medicine

## 2025-01-01 DIAGNOSIS — N898 Other specified noninflammatory disorders of vagina: Secondary | ICD-10-CM

## 2025-01-01 MED ORDER — FLUCONAZOLE 150 MG PO TABS: ORAL_TABLET | ORAL | 0 refills | Status: AC

## 2025-02-12 ENCOUNTER — Encounter: Payer: Medicaid Other | Admitting: Medical

## 2025-02-14 ENCOUNTER — Encounter: Admitting: Medical
